# Patient Record
Sex: Female | Born: 1995 | Race: Black or African American | Hispanic: No | Marital: Single | Smoking: Former smoker
Health system: Southern US, Community
[De-identification: ages and names within clinical notes are randomized; demographics above are authoritative.]

## PROBLEM LIST (undated history)

## (undated) DIAGNOSIS — Z8619 Personal history of other infectious and parasitic diseases: Secondary | ICD-10-CM

## (undated) DIAGNOSIS — E669 Obesity, unspecified: Secondary | ICD-10-CM

## (undated) HISTORY — DX: Obesity, unspecified: E66.9

## (undated) HISTORY — DX: Personal history of other infectious and parasitic diseases: Z86.19

---
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
## 2014-05-04 ENCOUNTER — Emergency Department: Payer: Self-pay | Admitting: Emergency Medicine

## 2014-05-04 LAB — CBC
HCT: 37 % (ref 35.0–47.0)
HGB: 11.7 g/dL — ABNORMAL LOW (ref 12.0–16.0)
MCH: 26 pg (ref 26.0–34.0)
MCHC: 31.6 g/dL — ABNORMAL LOW (ref 32.0–36.0)
MCV: 82 fL (ref 80–100)
Platelet: 316 10*3/uL (ref 150–440)
RBC: 4.49 10*6/uL (ref 3.80–5.20)
RDW: 13.1 % (ref 11.5–14.5)
WBC: 17.1 10*3/uL — ABNORMAL HIGH (ref 3.6–11.0)

## 2014-05-04 LAB — URINALYSIS, COMPLETE
BILIRUBIN, UR: NEGATIVE
Glucose,UR: NEGATIVE mg/dL (ref 0–75)
KETONE: NEGATIVE
NITRITE: NEGATIVE
PH: 6 (ref 4.5–8.0)
Protein: 30
Specific Gravity: 1.02 (ref 1.003–1.030)
Squamous Epithelial: 2
WBC UR: 220 /HPF (ref 0–5)

## 2014-05-07 ENCOUNTER — Emergency Department: Payer: Self-pay | Admitting: Emergency Medicine

## 2014-05-07 LAB — URINALYSIS, COMPLETE
Bilirubin,UR: NEGATIVE
Glucose,UR: NEGATIVE mg/dL (ref 0–75)
KETONE: NEGATIVE
Nitrite: NEGATIVE
Ph: 6 (ref 4.5–8.0)
Protein: NEGATIVE
Specific Gravity: 1.016 (ref 1.003–1.030)
Squamous Epithelial: 2
WBC UR: 108 /HPF (ref 0–5)

## 2014-05-07 LAB — CBC
HCT: 35.7 % (ref 35.0–47.0)
HGB: 11.2 g/dL — ABNORMAL LOW (ref 12.0–16.0)
MCH: 26.1 pg (ref 26.0–34.0)
MCHC: 31.4 g/dL — AB (ref 32.0–36.0)
MCV: 83 fL (ref 80–100)
Platelet: 307 10*3/uL (ref 150–440)
RBC: 4.29 10*6/uL (ref 3.80–5.20)
RDW: 13.2 % (ref 11.5–14.5)
WBC: 12.2 10*3/uL — AB (ref 3.6–11.0)

## 2014-05-09 LAB — URINE CULTURE

## 2014-06-06 ENCOUNTER — Emergency Department: Payer: Self-pay | Admitting: Emergency Medicine

## 2014-06-06 LAB — PREGNANCY, URINE: Pregnancy Test, Urine: NEGATIVE m[IU]/mL

## 2014-08-27 ENCOUNTER — Emergency Department: Payer: Self-pay | Admitting: Emergency Medicine

## 2014-08-27 LAB — URINALYSIS, COMPLETE
Bacteria: NONE SEEN
Bilirubin,UR: NEGATIVE
GLUCOSE, UR: NEGATIVE mg/dL (ref 0–75)
KETONE: NEGATIVE
LEUKOCYTE ESTERASE: NEGATIVE
Nitrite: NEGATIVE
PROTEIN: NEGATIVE
Ph: 5 (ref 4.5–8.0)
SPECIFIC GRAVITY: 1.026 (ref 1.003–1.030)
Squamous Epithelial: 16
WBC UR: 4 /HPF (ref 0–5)

## 2014-08-27 LAB — PREGNANCY, URINE: Pregnancy Test, Urine: NEGATIVE m[IU]/mL

## 2014-11-17 ENCOUNTER — Emergency Department: Payer: Self-pay | Admitting: Emergency Medicine

## 2014-12-16 ENCOUNTER — Emergency Department: Payer: Self-pay | Admitting: Emergency Medicine

## 2014-12-16 LAB — URINALYSIS, COMPLETE
BILIRUBIN, UR: NEGATIVE
Glucose,UR: NEGATIVE mg/dL (ref 0–75)
KETONE: NEGATIVE
Nitrite: NEGATIVE
PH: 6 (ref 4.5–8.0)
Protein: NEGATIVE
Specific Gravity: 1.018 (ref 1.003–1.030)
Squamous Epithelial: 17

## 2014-12-18 LAB — URINE CULTURE

## 2015-01-22 ENCOUNTER — Emergency Department: Payer: Self-pay | Admitting: Emergency Medicine

## 2015-02-13 ENCOUNTER — Emergency Department: Admit: 2015-02-13 | Disposition: A | Discharge status: Home / Self Care | Payer: Self-pay | Admitting: Internal Medicine

## 2015-02-13 LAB — URINALYSIS, COMPLETE
Bacteria: NONE SEEN
Bilirubin,UR: NEGATIVE
Glucose,UR: NEGATIVE mg/dL (ref 0–75)
KETONE: NEGATIVE
Leukocyte Esterase: NEGATIVE
NITRITE: NEGATIVE
PH: 5 (ref 4.5–8.0)
Protein: NEGATIVE
RBC,UR: <1 /HPF (ref 0–5)
Specific Gravity: 1.016 (ref 1.003–1.030)
Squamous Epithelial: 1
WBC UR: 1 /HPF (ref 0–5)

## 2015-02-13 LAB — CBC
HCT: 38.1 % (ref 35.0–47.0)
HGB: 12.3 g/dL (ref 12.0–16.0)
MCH: 25.9 pg — ABNORMAL LOW (ref 26.0–34.0)
MCHC: 32.1 g/dL (ref 32.0–36.0)
MCV: 81 fL (ref 80–100)
Platelet: 318 10*3/uL (ref 150–440)
RBC: 4.73 10*6/uL (ref 3.80–5.20)
RDW: 13.5 % (ref 11.5–14.5)
WBC: 9.6 10*3/uL (ref 3.6–11.0)

## 2015-02-13 LAB — COMPREHENSIVE METABOLIC PANEL
ALBUMIN: 3.9 g/dL
ALK PHOS: 85 U/L
ALT: 14 U/L
ANION GAP: 5 — AB (ref 7–16)
AST: 19 U/L
BUN: 12 mg/dL
Bilirubin,Total: 0.6 mg/dL
CALCIUM: 9.2 mg/dL
CHLORIDE: 107 mmol/L
Co2: 25 mmol/L
Creatinine: 0.68 mg/dL
EGFR (African American): >60
EGFR (Non-African Amer.): >60
Glucose: 98 mg/dL
Potassium: 4.1 mmol/L
Sodium: 137 mmol/L
Total Protein: 7.5 g/dL

## 2015-02-13 LAB — HCG, QUANTITATIVE, PREGNANCY: BETA HCG, QUANT.: 1 m[IU]/mL

## 2015-03-12 ENCOUNTER — Emergency Department
Admit: 2015-03-12 | Disposition: A | Discharge status: Home / Self Care | Payer: Self-pay | Admitting: Emergency Medicine

## 2015-03-23 NOTE — H&P (Signed)
L&D Evaluation:  History:   HPI 19 year old G2P0 presents to L&D with c/o ctx's this AM. EDD 9/17, 40 weeks today. [facility], [facility] that resolved, and + chlamydia that was treated (neg 8/27). No significant events during pregnancy. labs: O Positive, RI, VI, HIV and Hep B Negative, RPR NR GBS Negative    Presents with contractions    Patient's Medical History No Chronic Illness    Patient's Surgical History none    Medications Pre Natal Vitamins    Allergies NKDA    Social History none    Family History Non-Contributory   ROS:   ROS All systems were reviewed.  HEENT, CNS, GI, GU, Respiratory, CV, Renal and Musculoskeletal systems were found to be normal.   Exam:   Vital Signs stable    Urine Protein not completed    General no apparent distress, very drowsy    Mental Status clear  drowsy right now    Abdomen gravid, tender with contractions    Estimated Fetal Weight Average for gestational age    Back no CVAT    Edema no edema    Pelvic no external lesions, 4 on admission, 8-9 now    Mebranes Intact, bulging    FHT normal rate with no decels    Ucx regular   Impression:   Impression active labor   Plan:   Plan EFM/NST, IV analgesia, epdiural if desired, anticipate SVD   Electronic Signatures: Shella MaximPutnam, Bralynn Velador (CNM)  (Signed 17-Sep-13 07:45)  Authored: L&D Evaluation   Last Updated: 17-Sep-13 07:45 by Shella MaximPutnam, Shadai Mcclane (CNM)

## 2015-03-23 NOTE — H&P (Signed)
L&D Evaluation:  History Expanded:  HPI 19 [facility], she was last seen in clinic in September, she has not had A GBS or aptima done, she is 39 w 3 days, and SROM at 1100 today no bleeding good fetal movement, having some cramping and some contractions, she has not been checked yet. Has not gotten her tdap, or flu, only 4 visits her whole preganncy   Gravida 3   Term 1   PreTerm 0   Abortion 1   Living 1   Blood Type (Maternal) O positive   Group B Strep Results Maternal (Result >5wks must be treated as unknown) unknown/result > 5 weeks ago   Maternal HIV Negative   Maternal Syphilis Ab Nonreactive   Maternal Varicella Immune   Rubella Results (Maternal) immune   Maternal T-Dap Unknown   [facility]   Presents with contractions, SROM   Patient's Medical History No Chronic Illness   Patient's Surgical History arm surgery ; T and A,   Medications Pre Natal Vitamins  Iron   Allergies NKDA   Social History none   Family History Non-Contributory   Current Prenatal Course Notable For very poor prenatal care   ROS:  ROS All systems were reviewed.  HEENT, CNS, GI, GU, Respiratory, CV, Renal and Musculoskeletal systems were found to be normal.   Exam:  Vital Signs stable   Urine Protein not completed   General no apparent distress   Mental Status clear   Chest clear   Heart normal sinus rhythm   Abdomen gravid, non-tender   Estimated Fetal Weight Small for gestational age   Fetal Position v   Back no CVAT   Edema no edema   Reflexes 1+   Pelvic no external lesions   Mebranes Ruptured   Description clear   FHT normal rate with no decels, strictly reactive,   Fetal Heart Rate 140   Ucx irregular   Skin dry   Lymph no lymphadenopathy   Impression:  Impression early labor, unknown GBS   Plan:  Plan EFM/NST, antibiotics for GBBS prophylaxis   Comments anticipate svd, start pitocin  after second dose of antibiotics.   Follow Up Appointment need to schedule. in 6 weeks   Electronic Signatures: Adria DevonKlett, Gideon Burstein (MD)  (Signed 06-Nov-14 16:11)  Authored: L&D Evaluation   Last Updated: 06-Nov-14 16:11 by Adria DevonKlett, Nettie Wyffels (MD)

## 2015-04-27 ENCOUNTER — Encounter: Payer: Self-pay | Admitting: Emergency Medicine

## 2015-04-27 ENCOUNTER — Emergency Department
Admission: EM | Admit: 2015-04-27 | Discharge: 2015-04-27 | Disposition: A | Discharge status: Home / Self Care | Payer: Self-pay | Attending: Emergency Medicine | Admitting: Emergency Medicine

## 2015-04-27 DIAGNOSIS — J029 Acute pharyngitis, unspecified: Secondary | ICD-10-CM

## 2015-04-27 DIAGNOSIS — J0110 Acute frontal sinusitis, unspecified: Secondary | ICD-10-CM | POA: Insufficient documentation

## 2015-04-27 DIAGNOSIS — Z720 Tobacco use: Secondary | ICD-10-CM | POA: Insufficient documentation

## 2015-04-27 MED ORDER — LORATADINE 10 MG PO TABS: 10.0000 mg | ORAL_TABLET | Freq: Every day | ORAL | Status: DC

## 2015-04-27 MED ORDER — AMOXICILLIN 500 MG PO CAPS
ORAL_CAPSULE | ORAL | Status: AC
  Administered 2015-04-27: 500 mg via ORAL
  Filled 2015-04-27: qty 1

## 2015-04-27 MED ORDER — AMOXICILLIN 500 MG PO TABS
500.0000 mg | ORAL_TABLET | Freq: Three times a day (TID) | ORAL | Status: DC
Start: 2015-04-27 — End: 2015-12-01

## 2015-04-27 MED ORDER — LORATADINE-PSEUDOEPHEDRINE ER 5-120 MG PO TB12: 1.0000 | ORAL_TABLET | Freq: Two times a day (BID) | ORAL | Status: DC

## 2015-04-27 MED ORDER — AMOXICILLIN 500 MG PO TABS: 500.0000 mg | ORAL_TABLET | Freq: Three times a day (TID) | ORAL | Status: DC

## 2015-04-27 MED ORDER — DEXAMETHASONE SODIUM PHOSPHATE 10 MG/ML IJ SOLN
INTRAMUSCULAR | Status: AC
  Administered 2015-04-27: 10 mg via INTRAMUSCULAR
  Filled 2015-04-27: qty 1

## 2015-04-27 MED ORDER — OXYMETAZOLINE HCL 0.05 % NA SOLN: 1.0000 | Freq: Two times a day (BID) | NASAL | Status: DC

## 2015-04-27 MED ORDER — KETOROLAC TROMETHAMINE 30 MG/ML IJ SOLN
30.0000 mg | Freq: Once | INTRAMUSCULAR | Status: AC
  Administered 2015-04-27: 30 mg via INTRAVENOUS

## 2015-04-27 MED ORDER — AMOXICILLIN 500 MG PO CAPS
500.0000 mg | ORAL_CAPSULE | Freq: Once | ORAL | Status: AC
  Administered 2015-04-27: 500 mg via ORAL

## 2015-04-27 MED ORDER — DEXAMETHASONE SODIUM PHOSPHATE 10 MG/ML IJ SOLN
10.0000 mg | Freq: Once | INTRAMUSCULAR | Status: AC
  Administered 2015-04-27: 10 mg via INTRAMUSCULAR

## 2015-04-27 MED ORDER — PREDNISONE 20 MG PO TABS: 40.0000 mg | ORAL_TABLET | Freq: Every day | ORAL | Status: DC

## 2015-04-27 MED ORDER — KETOROLAC TROMETHAMINE 30 MG/ML IJ SOLN
INTRAMUSCULAR | Status: AC
  Administered 2015-04-27: 30 mg via INTRAVENOUS
  Filled 2015-04-27: qty 1

## 2015-04-27 MED ORDER — SODIUM CHLORIDE 0.9 % IV BOLUS (SEPSIS)
1000.0000 mL | Freq: Once | INTRAVENOUS | Status: AC
Start: 2015-04-27 — End: 2015-04-27
  Administered 2015-04-27: 1000 mL via INTRAVENOUS

## 2015-04-27 NOTE — ED Notes (Signed)
Sore throat and headache for two days.

## 2015-04-27 NOTE — ED Provider Notes (Signed)
CSN: [id number]     Arrival date & time 04/27/15  1552 History   First MD Initiated Contact with Patient 04/27/15 1643     Chief Complaint  Patient presents with  . Sore Throat     (Consider location/radiation/quality/duration/timing/severity/associated sxs/prior Treatment) HPI 4 day history of worsening sore throat subjective fevers 1 day of not eating or drinking due to burning in the back of her throat swelling also notes sinus congestion and drainage rates her overall discomfort as a 10 out of 10 burning type pain in her head throbbing nothing making it particularly better or worse has not tried any prior medications her home therapies and states no other complaints at this time   History reviewed. No pertinent past medical history. History reviewed. No pertinent past surgical history. No family history on file. History  Substance Use Topics  . Smoking status: Current Every Day Smoker  . Smokeless tobacco: Not on file  . Alcohol Use: Yes   OB History    No data available     Review of Systems  Constitutional: Positive for fever.  HENT: Positive for congestion, ear pain, postnasal drip, rhinorrhea, sinus pressure and sore throat.   Eyes: Negative.   Respiratory: Negative.   Cardiovascular: Negative.   Gastrointestinal: Negative.   Endocrine: Negative.   Genitourinary: Negative.   Musculoskeletal: Negative.   Skin: Negative.   Allergic/Immunologic: Negative.   Neurological: Negative.   All other systems reviewed and are negative.     Allergies  Review of patient's allergies indicates no known allergies.  Home Medications   Prior to Admission medications   Medication Sig Start Date End Date Taking? Authorizing Provider  amoxicillin (AMOXIL) 500 MG tablet Take 1 tablet (500 mg total) by mouth 3 (three) times daily. 04/27/15   Briyah Wheelwright William C Athol Bolds, PA-C  loratadine (CLARITIN) 10 MG tablet Take 1 tablet (10 mg total) by mouth daily. 04/27/15   Fayette Hamada Kristine Garbe Emma Birchler,  PA-C  oxymetazoline (AFRIN NASAL SPRAY) 0.05 % nasal spray Place 1 spray into both nostrils 2 (two) times daily. For three days 04/27/15   Carnelius Hammitt William C Faye Strohman, PA-C  predniSONE (DELTASONE) 20 MG tablet Take 2 tablets (40 mg total) by mouth daily with breakfast. 04/27/15   Kyle Stansell Kristine Garbe Elianis Fischbach, PA-C   BP 132/55 mmHg  Pulse 102  Temp(Src) 99.4 F (37.4 C) (Oral)  Resp 16  Ht  (1.549 m)  Wt 185 lb (83.915 kg)  BMI 34.97 kg/m2  SpO2 98%  LMP 04/20/2015 Physical Exam  Female appearing stated age well-developed well-nourished no acute distress Vitals reviewed Head ears eyes nose neck and throat examination this patient reveals frontal sinus tenderness +2 tonsils exudate anterior cervical adenopathy Cardiovascular regular rate and rhythm no murmurs rubs gallops Pulmonary lungs clear to auscultation bilaterally Skin free of rash or disease Musculoskeletal moving all extremities without difficulty no noticeable functional deficits Neuro exam nonfocal cranial nerves II through XII grossly intact Psychologically patient is very pleasant acting appropriately   ED Course  Procedures (including critical care time) Labs Review Labs Reviewed - No data to display  Imaging Review No results found.   EKG Interpretation None     Given patient's history of not eating or drinking due to throat swelling and soreness in her tachycardia she was given 1 L bolus of fluids IV Decadron and Toradol amoxicillin was started she is feeling better    MDM  go ahead and discharge her home return if any acute concerns or  worsening symptoms Amoxicillin prednisone decongestants   Final diagnoses:  Acute pharyngitis, unspecified pharyngitis type  Acute frontal sinusitis, recurrence not specified        Talene Glastetter Rosalyn Gess, PA-C 04/27/15 1912  Loleta Rose, MD 04/28/15 0028

## 2015-05-20 ENCOUNTER — Encounter: Payer: Self-pay | Admitting: Emergency Medicine

## 2015-05-20 ENCOUNTER — Emergency Department
Admission: EM | Admit: 2015-05-20 | Discharge: 2015-05-20 | Disposition: A | Discharge status: Home / Self Care | Payer: Self-pay | Attending: Emergency Medicine | Admitting: Emergency Medicine

## 2015-05-20 DIAGNOSIS — K029 Dental caries, unspecified: Secondary | ICD-10-CM | POA: Insufficient documentation

## 2015-05-20 DIAGNOSIS — K0889 Other specified disorders of teeth and supporting structures: Secondary | ICD-10-CM

## 2015-05-20 DIAGNOSIS — Z792 Long term (current) use of antibiotics: Secondary | ICD-10-CM | POA: Insufficient documentation

## 2015-05-20 DIAGNOSIS — Z79899 Other long term (current) drug therapy: Secondary | ICD-10-CM | POA: Insufficient documentation

## 2015-05-20 DIAGNOSIS — Z720 Tobacco use: Secondary | ICD-10-CM | POA: Insufficient documentation

## 2015-05-20 DIAGNOSIS — Z7952 Long term (current) use of systemic steroids: Secondary | ICD-10-CM | POA: Insufficient documentation

## 2015-05-20 DIAGNOSIS — K088 Other specified disorders of teeth and supporting structures: Secondary | ICD-10-CM | POA: Insufficient documentation

## 2015-05-20 DIAGNOSIS — K047 Periapical abscess without sinus: Secondary | ICD-10-CM | POA: Insufficient documentation

## 2015-05-20 MED ORDER — IBUPROFEN 800 MG PO TABS: 800.0000 mg | ORAL_TABLET | Freq: Three times a day (TID) | ORAL | Status: DC | PRN

## 2015-05-20 MED ORDER — HYDROCODONE-ACETAMINOPHEN 5-325 MG PO TABS
1.0000 | ORAL_TABLET | Freq: Once | ORAL | Status: AC
  Administered 2015-05-20: 1 via ORAL

## 2015-05-20 MED ORDER — PENICILLIN V POTASSIUM 500 MG PO TABS
500.0000 mg | ORAL_TABLET | Freq: Once | ORAL | Status: AC
  Administered 2015-05-20: 500 mg via ORAL

## 2015-05-20 MED ORDER — PENICILLIN V POTASSIUM 500 MG PO TABS
ORAL_TABLET | ORAL | Status: AC
  Administered 2015-05-20: 500 mg via ORAL
  Filled 2015-05-20: qty 1

## 2015-05-20 MED ORDER — HYDROCODONE-ACETAMINOPHEN 5-325 MG PO TABS
ORAL_TABLET | ORAL | Status: AC
  Administered 2015-05-20: 1 via ORAL
  Filled 2015-05-20: qty 1

## 2015-05-20 MED ORDER — PENICILLIN V POTASSIUM 500 MG PO TABS: 500.0000 mg | ORAL_TABLET | Freq: Four times a day (QID) | ORAL | Status: DC

## 2015-05-20 NOTE — Discharge Instructions (Signed)
You do not have a drainable dental abscess at this time. Take Pen-Vee K. Use ibuprofen for discomfort. The need to follow-up with a dentist for further care, otherwise this problem will likely continue to reoccur. Return to the emergency department if you're swelling gets worse, if your face is swelling, if you have fevers, or if you have other urgent concerns.  Dental Abscess A dental abscess is a collection of infected fluid (pus) from a bacterial infection in the inner part of the tooth (pulp). It usually occurs at the end of the tooth's root.  CAUSES   Severe tooth decay.  Trauma to the tooth that allows bacteria to enter into the pulp, such as a broken or chipped tooth. SYMPTOMS   Severe pain in and around the infected tooth.  Swelling and redness around the abscessed tooth or in the mouth or face.  Tenderness.  Pus drainage.  Bad breath.  Bitter taste in the mouth.  Difficulty swallowing.  Difficulty opening the mouth.  Nausea.  Vomiting.  Chills.  Swollen neck glands. DIAGNOSIS   A medical and dental history will be taken.  An examination will be performed by tapping on the abscessed tooth.  X-rays may be taken of the tooth to identify the abscess. TREATMENT The goal of treatment is to eliminate the infection. You may be prescribed antibiotic medicine to stop the infection from spreading. A root canal may be performed to save the tooth. If the tooth cannot be saved, it may be pulled (extracted) and the abscess may be drained.  HOME CARE INSTRUCTIONS  Only take over-the-counter or prescription medicines for pain, fever, or discomfort as directed by your caregiver.  Rinse your mouth (gargle) often with salt water ( tsp salt in 8 oz [250 ml] of warm water) to relieve pain or swelling.  Do not drive after taking pain medicine (narcotics).  Do not apply heat to the outside of your face.  Return to your dentist for further treatment as directed. SEEK MEDICAL  CARE IF:  Your pain is not helped by medicine.  Your pain is getting worse instead of better. SEEK IMMEDIATE MEDICAL CARE IF:  You have a fever or persistent symptoms for more than 2-3 days.  You have a fever and your symptoms suddenly get worse.  You have chills or a very bad headache.  You have problems breathing or swallowing.  You have trouble opening your mouth.  You have swelling in the neck or around the eye. Document Released: 10/30/2005 Document Revised: 07/24/2012 Document Reviewed: 02/07/2011 [facility] Patient Information 2015 [employer] [place]. This information is not intended to replace advice given to you by your health care provider. Make sure you discuss any questions you have with your health care provider.

## 2015-05-20 NOTE — ED Notes (Signed)
Patient states that she has an abscess to the inside of her left mouth times three days. Patient reports that it did drain this morning but that she continues to have pain and swelling.

## 2015-05-20 NOTE — ED Provider Notes (Signed)
[facility] Emergency Department Provider Note  ____________________________________________  Time seen: 2000  I have reviewed the triage vital signs and the nursing notes.   HISTORY  Chief Complaint Abscess  dental pain    HPI Brianna Juarez is a 19 y.o. female who reports that she has been having pain in her left lower jaw for 2-3 days and earlier today she felt drainage from an abscess in this area.  She reports this is the second and third time that this problem has occurred in this area. She has not been able to give a dentist with the prior episodes.  She does have a significantly decayed tooth in this area (#19).  There is some mild swelling to the left jaw with some discomfort.  He denies any fever.   No past medical history on file. Patient reports no medical problems.   There are no active problems to display for this patient.   No past surgical history on file.  Current Outpatient Rx  Name  Route  Sig  Dispense  Refill  . amoxicillin (AMOXIL) 500 MG tablet   Oral   Take 1 tablet (500 mg total) by mouth 3 (three) times daily.   21 tablet   0   . ibuprofen (ADVIL,MOTRIN) 800 MG tablet   Oral   Take 1 tablet (800 mg total) by mouth every 8 (eight) hours as needed.   24 tablet   0   . loratadine (CLARITIN) 10 MG tablet   Oral   Take 1 tablet (10 mg total) by mouth daily.   30 tablet   0   . oxymetazoline (AFRIN NASAL SPRAY) 0.05 % nasal spray   Each Nare   Place 1 spray into both nostrils 2 (two) times daily. For three days   30 mL   0   . penicillin v potassium (VEETID) 500 MG tablet   Oral   Take 1 tablet (500 mg total) by mouth 4 (four) times daily.   24 tablet   0   . predniSONE (DELTASONE) 20 MG tablet   Oral   Take 2 tablets (40 mg total) by mouth daily with breakfast.   8 tablet   0     Allergies Review of patient's allergies indicates no known allergies.  No family history on file.  Social  History History  Substance Use Topics  . Smoking status: Current Every Day Smoker -- 0.25 packs/day  . Smokeless tobacco: Not on file  . Alcohol Use: Yes     Comment: occasionaly    Review of Systems  Constitutional: Negative for fever. ENT: Patient had a sore throat approximately 3 and half weeks ago. She is seen in the emergency department that time. Sore throat is resolved. Patient is of dental pain. See history of present illness Cardiovascular: Negative for chest pain. Respiratory: Negative for shortness of breath. Gastrointestinal: Negative for abdominal pain, vomiting and diarrhea. Genitourinary: Negative for dysuria. Musculoskeletal: No myalgias or injuries.    10-point ROS otherwise negative.  ____________________________________________   PHYSICAL EXAM:  VITAL SIGNS: ED Triage Vitals  Enc Vitals Group     BP 05/20/15 1951 122/93 mmHg     Pulse Rate 05/20/15 1951 86     Resp 05/20/15 1951 18     Temp 05/20/15 1951 98.1 F (36.7 C)     Temp Source 05/20/15 1951 Oral     SpO2 05/20/15 1951 97 %     Weight 05/20/15 1951 185 lb (83.915 kg)  Height 05/20/15 1951 5\' 1"  (1.549 m)     Head Cir --      Peak Flow --      Pain Score 05/20/15 1951 10     Pain Loc --      Pain Edu? --      Excl. in GC? --     Constitutional: Alert and oriented. Well appearing and in no distress. ENT   Head: Normocephalic and atraumatic.   Nose: No congestion/rhinnorhea.   Mouth/Throat: Mucous membranes are moist. There is a tooth with significant decay on the left lower (#19). There is some mild inflammation around this tooth with redness. There is no appreciable abscess. There is a degree of fullness in the left lower jaw adjacent to where this decay to this. Cardiovascular: Normal rate, regular rhythm, no murmur noted Respiratory:  Normal respiratory effort, no tachypnea.    Breath sounds are clear and equal bilaterally.  Gastrointestinal: Soft and nontender. No  distention.  Back: No muscle spasm, no tenderness, no CVA tenderness. Musculoskeletal: No deformity noted. Nontender with normal range of motion in all extremities.  No noted edema. Psychiatric: Mood and affect are normal. Speech and behavior are normal.  ____________________________________________    INITIAL IMPRESSION / ASSESSMENT AND PLAN / ED COURSE  Pertinent labs & imaging results that were available during my care of the patient were reviewed by me and considered in my medical decision making (see chart for details).   Patient with a ental infection and dental pain. I do not see a drainable abscess. This is likely in part due to the fact that what small pocket she may have had has already self drained.  We will prescribe pen VK and ibuprofen for this patient. I will provide her with 1 Vicodin now will she is in the emergency department. I have counseled her on the importance of following with a dentist for this recurrent problem.  The patient did ask me how long until she could resume smoking cigarettes. I took this opportunity to counsel the patient on cessation. We discussed the reasons he smokes and ways to quit. The patient is vacillating regarding quitting.  ____________________________________________   FINAL CLINICAL IMPRESSION(S) / ED DIAGNOSES  Final diagnoses:  Pain, dental  Tooth decay  Dental infection      Darien Ramusavid W Charlii Yost, MD 05/20/15 2017

## 2015-06-08 ENCOUNTER — Encounter: Payer: Self-pay | Admitting: *Deleted

## 2015-06-08 ENCOUNTER — Emergency Department
Admission: EM | Admit: 2015-06-08 | Discharge: 2015-06-08 | Disposition: A | Discharge status: Home / Self Care | Payer: Self-pay | Attending: Emergency Medicine | Admitting: Emergency Medicine

## 2015-06-08 ENCOUNTER — Other Ambulatory Visit: Payer: Self-pay

## 2015-06-08 DIAGNOSIS — K047 Periapical abscess without sinus: Secondary | ICD-10-CM | POA: Insufficient documentation

## 2015-06-08 DIAGNOSIS — Z720 Tobacco use: Secondary | ICD-10-CM | POA: Insufficient documentation

## 2015-06-08 MED ORDER — IBUPROFEN 800 MG PO TABS: 800.0000 mg | ORAL_TABLET | Freq: Three times a day (TID) | ORAL | Status: DC | PRN

## 2015-06-08 MED ORDER — CEPHALEXIN 250 MG PO CAPS
250.0000 mg | ORAL_CAPSULE | Freq: Four times a day (QID) | ORAL | Status: AC
Start: 2015-06-08 — End: 2015-06-18

## 2015-06-08 NOTE — ED Provider Notes (Addendum)
[facility] Emergency Department Provider Note     Time seen: ----------------------------------------- 9:20 PM on 06/08/2015 -----------------------------------------    I have reviewed the triage vital signs and the nursing notes.   HISTORY  Chief Complaint Dental Pain and Pleurisy    HPI Brianna Juarez is a 19 y.o. female who presents ER with left lower jaw pain for several days. Patient was seen a couple weeks ago for the same thing. Also states it hurts take a deep breath.  Plan as jaw pain earlier today she felt drainage from the abscess in this area. Pain is ache, severe.   No past medical history on file.  There are no active problems to display for this patient.   No past surgical history on file.  Allergies Review of patient's allergies indicates no known allergies.  Social History History  Substance Use Topics  . Smoking status: Current Every Day Smoker -- 0.25 packs/day  . Smokeless tobacco: Not on file  . Alcohol Use: Yes     Comment: occasionaly    Review of Systems Constitutional: Negative for fever. Eyes: Negative for visual changes. ENT: Negative for sore throat. Positive for left-sided jaw pain and toothache  10-point ROS otherwise negative.  ____________________________________________   PHYSICAL EXAM:  VITAL SIGNS: ED Triage Vitals  Enc Vitals Group     BP --      Pulse Rate 06/08/15 2053 98     Resp 06/08/15 2053 18     Temp 06/08/15 2053 98.2 F (36.8 C)     Temp Source 06/08/15 2053 Oral     SpO2 06/08/15 2053 100 %     Weight 06/08/15 2053 185 lb (83.915 kg)     Height 06/08/15 2053  (1.575 m)     Head Cir --      Peak Flow --      Pain Score 06/08/15 2054 9     Pain Loc --      Pain Edu? --      Excl. in GC? --   EKG: Normal sinus rhythm with a rate of 18 99 bpm, normal PR interval, normal QRS with, normal QT interval. There are some nonspecific T-wave inversions that are consistent  with prior EKG.  Constitutional: Alert and oriented. Well appearing and in no distress. Eyes: Conjunctivae are normal. PERRL. Normal extraocular movements. ENT   Head: Normocephalic and atraumatic.   Nose: No congestion/rhinnorhea.   Mouth/Throat: Pointing Dental abscess noted around tooth #18   Neck: No stridor. ____________________________________________  ED COURSE:  Pertinent labs & imaging results that were available during my care of the patient were reviewed by me and considered in my medical decision making (see chart for details). I was able to take a tongue blade and open the abscess with copious purulent drainage. ____________________________________________  FINAL ASSESSMENT AND PLAN  Dental abscess status post drainage  Plan: Patient with labs and imaging as dictated above. Patient will continue home with antibiotics, pain medication and dental referral. I'm unsure what to make of her chest pain complaints, she looks well, stable for outpatient follow-up.   Emily Filbert, MD   Emily Filbert, MD 06/08/15 0865  Emily Filbert, MD 06/08/15 2128

## 2015-06-08 NOTE — Discharge Instructions (Signed)

## 2015-06-08 NOTE — ED Notes (Signed)
Pt has pain in left lower tooth since yesterday.  Pt also reports  chest pain.  States it hurts to take a deep breath.  Sx began this am.

## 2015-09-23 ENCOUNTER — Emergency Department
Admission: EM | Admit: 2015-09-23 | Discharge: 2015-09-23 | Disposition: A | Discharge status: Home / Self Care | Payer: Self-pay | Attending: Emergency Medicine | Admitting: Emergency Medicine

## 2015-09-23 DIAGNOSIS — Z3490 Encounter for supervision of normal pregnancy, unspecified, unspecified trimester: Secondary | ICD-10-CM

## 2015-09-23 DIAGNOSIS — O2341 Unspecified infection of urinary tract in pregnancy, first trimester: Secondary | ICD-10-CM | POA: Insufficient documentation

## 2015-09-23 DIAGNOSIS — Z87891 Personal history of nicotine dependence: Secondary | ICD-10-CM | POA: Insufficient documentation

## 2015-09-23 DIAGNOSIS — Z79899 Other long term (current) drug therapy: Secondary | ICD-10-CM | POA: Insufficient documentation

## 2015-09-23 DIAGNOSIS — Z7952 Long term (current) use of systemic steroids: Secondary | ICD-10-CM | POA: Insufficient documentation

## 2015-09-23 DIAGNOSIS — R42 Dizziness and giddiness: Secondary | ICD-10-CM | POA: Insufficient documentation

## 2015-09-23 DIAGNOSIS — Z792 Long term (current) use of antibiotics: Secondary | ICD-10-CM | POA: Insufficient documentation

## 2015-09-23 DIAGNOSIS — Z3A01 Less than 8 weeks gestation of pregnancy: Secondary | ICD-10-CM | POA: Insufficient documentation

## 2015-09-23 LAB — CBC
HCT: 36.3 % (ref 35.0–47.0)
HEMOGLOBIN: 12.1 g/dL (ref 12.0–16.0)
MCH: 26.4 pg (ref 26.0–34.0)
MCHC: 33.3 g/dL (ref 32.0–36.0)
MCV: 79.4 fL — AB (ref 80.0–100.0)
Platelets: 359 10*3/uL (ref 150–440)
RBC: 4.57 MIL/uL (ref 3.80–5.20)
RDW: 13.9 % (ref 11.5–14.5)
WBC: 10.3 10*3/uL (ref 3.6–11.0)

## 2015-09-23 LAB — COMPREHENSIVE METABOLIC PANEL
ALK PHOS: 88 U/L (ref 38–126)
ALT: 12 U/L — AB (ref 14–54)
AST: 16 U/L (ref 15–41)
Albumin: 4.1 g/dL (ref 3.5–5.0)
Anion gap: 6 (ref 5–15)
BILIRUBIN TOTAL: 0.4 mg/dL (ref 0.3–1.2)
BUN: 11 mg/dL (ref 6–20)
CALCIUM: 9.3 mg/dL (ref 8.9–10.3)
CO2: 26 mmol/L (ref 22–32)
CREATININE: 0.64 mg/dL (ref 0.44–1.00)
Chloride: 103 mmol/L (ref 101–111)
Glucose, Bld: 90 mg/dL (ref 65–99)
Potassium: 3.6 mmol/L (ref 3.5–5.1)
SODIUM: 135 mmol/L (ref 135–145)
Total Protein: 8.1 g/dL (ref 6.5–8.1)

## 2015-09-23 LAB — URINALYSIS COMPLETE WITH MICROSCOPIC (ARMC ONLY)
Bilirubin Urine: NEGATIVE
Glucose, UA: NEGATIVE mg/dL
Hgb urine dipstick: NEGATIVE
KETONES UR: NEGATIVE mg/dL
Nitrite: NEGATIVE
Protein, ur: NEGATIVE mg/dL
Specific Gravity, Urine: 1.017 (ref 1.005–1.030)
pH: 6 (ref 5.0–8.0)

## 2015-09-23 LAB — POCT PREGNANCY, URINE: Preg Test, Ur: POSITIVE — AB

## 2015-09-23 MED ORDER — PRENATAL MULTIVITAMIN CH: 1.0000 | ORAL_TABLET | Freq: Every day | ORAL | Status: DC

## 2015-09-23 MED ORDER — CEPHALEXIN 500 MG PO CAPS: 500.0000 mg | ORAL_CAPSULE | Freq: Two times a day (BID) | ORAL | Status: DC

## 2015-09-23 NOTE — Discharge Instructions (Signed)
Your evaluation in the emergency department was reassuring.  We recommend that you drink plenty of clear fluids and follow-up with [facility] when the time as appropriate for your first visit.  Return to the emergency department with new or worsening symptoms that concern you.   First Trimester of Pregnancy The first trimester of pregnancy is from week 1 until the end of week 12 (months 1 through 3). A week after a sperm fertilizes an egg, the egg will implant on the wall of the uterus. This embryo will begin to develop into a baby. Genes from you and your partner are forming the baby. The female genes determine whether the baby is a boy or a girl. At 6-8 weeks, the eyes and face are formed, and the heartbeat can be seen on ultrasound. At the end of 12 weeks, all the baby's organs are formed.  Now that you are pregnant, you will want to do everything you can to have a healthy baby. Two of the most important things are to get good prenatal care and to follow your health care provider's instructions. Prenatal care is all the medical care you receive before the baby's birth. This care will help prevent, find, and treat any problems during the pregnancy and childbirth. BODY CHANGES Your body goes through many changes during pregnancy. The changes vary from woman to woman.   You may gain or lose a couple of pounds at first.  You may feel sick to your stomach (nauseous) and throw up (vomit). If the vomiting is uncontrollable, call your health care provider.  You may tire easily.  You may develop headaches that can be relieved by medicines approved by your health care provider.  You may urinate more often. Painful urination may mean you have a bladder infection.  You may develop heartburn as a result of your pregnancy.  You may develop constipation because certain hormones are causing the muscles that push waste through your intestines to slow down.  You may develop hemorrhoids or swollen, bulging  veins (varicose veins).  Your breasts may begin to grow larger and become tender. Your nipples may stick out more, and the tissue that surrounds them (areola) may become darker.  Your gums may bleed and may be sensitive to brushing and flossing.  Dark spots or blotches (chloasma, mask of pregnancy) may develop on your face. This will likely fade after the baby is born.  Your menstrual periods will stop.  You may have a loss of appetite.  You may develop cravings for certain kinds of food.  You may have changes in your emotions from day to day, such as being excited to be pregnant or being concerned that something may go wrong with the pregnancy and baby.  You may have more vivid and strange dreams.  You may have changes in your hair. These can include thickening of your hair, rapid growth, and changes in texture. Some women also have hair loss during or after pregnancy, or hair that feels dry or thin. Your hair will most likely return to normal after your baby is born. WHAT TO EXPECT AT YOUR PRENATAL VISITS During a routine prenatal visit:  You will be weighed to make sure you and the baby are growing normally.  Your blood pressure will be taken.  Your abdomen will be measured to track your baby's growth.  The fetal heartbeat will be listened to starting around week 10 or 12 of your pregnancy.  Test results from any previous visits will  be discussed. Your health care provider may ask you:  How you are feeling.  If you are feeling the baby move.  If you have had any abnormal symptoms, such as leaking fluid, bleeding, severe headaches, or abdominal cramping.  If you are using any tobacco products, including cigarettes, chewing tobacco, and electronic cigarettes.  If you have any questions. Other tests that may be performed during your first trimester include:  Blood tests to find your blood type and to check for the presence of any previous infections. They will also be used  to check for low iron levels (anemia) and Rh antibodies. Later in the pregnancy, blood tests for diabetes will be done along with other tests if problems develop.  Urine tests to check for infections, diabetes, or protein in the urine.  An ultrasound to confirm the proper growth and development of the baby.  An amniocentesis to check for possible genetic problems.  Fetal screens for spina bifida and Down syndrome.  You may need other tests to make sure you and the baby are doing well.  HIV (human immunodeficiency virus) testing. Routine prenatal testing includes screening for HIV, unless you choose not to have this test. HOME CARE INSTRUCTIONS  Medicines  Follow your health care provider's instructions regarding medicine use. Specific medicines may be either safe or unsafe to take during pregnancy.  Take your prenatal vitamins as directed.  If you develop constipation, try taking a stool softener if your health care provider approves. Diet  Eat regular, well-balanced meals. Choose a variety of foods, such as meat or vegetable-based protein, fish, milk and low-fat dairy products, vegetables, fruits, and whole grain breads and cereals. Your health care provider will help you determine the amount of weight gain that is right for you.  Avoid raw meat and uncooked cheese. These carry germs that can cause birth defects in the baby.  Eating four or five small meals rather than three large meals a day may help relieve nausea and vomiting. If you start to feel nauseous, eating a few soda crackers can be helpful. Drinking liquids between meals instead of during meals also seems to help nausea and vomiting.  If you develop constipation, eat more high-fiber foods, such as fresh vegetables or fruit and whole grains. Drink enough fluids to keep your urine clear or pale yellow. Activity and Exercise  Exercise only as directed by your health care provider. Exercising will help you:  Control your  weight.  Stay in shape.  Be prepared for labor and delivery.  Experiencing pain or cramping in the lower abdomen or low back is a good sign that you should stop exercising. Check with your health care provider before continuing normal exercises.  Try to avoid standing for long periods of time. Move your legs often if you must stand in one place for a long time.  Avoid heavy lifting.  Wear low-heeled shoes, and practice good posture.  You may continue to have sex unless your health care provider directs you otherwise. Relief of Pain or Discomfort  Wear a good support bra for breast tenderness.   Take warm sitz baths to soothe any pain or discomfort caused by hemorrhoids. Use hemorrhoid cream if your health care provider approves.   Rest with your legs elevated if you have leg cramps or low back pain.  If you develop varicose veins in your legs, wear support hose. Elevate your feet for 15 minutes, 3-4 times a day. Limit salt in your diet. Prenatal Care  Schedule your prenatal visits by the twelfth week of pregnancy. They are usually scheduled monthly at first, then more often in the last 2 months before delivery.  Write down your questions. Take them to your prenatal visits.  Keep all your prenatal visits as directed by your health care provider. Safety  Wear your seat belt at all times when driving.  Make a list of emergency phone numbers, including numbers for family, friends, the hospital, and police and fire departments. General Tips  Ask your health care provider for a referral to a local prenatal education class. Begin classes no later than at the beginning of month 6 of your pregnancy.  Ask for help if you have counseling or nutritional needs during pregnancy. Your health care provider can offer advice or refer you to specialists for help with various needs.  Do not use hot tubs, steam rooms, or saunas.  Do not douche or use tampons or scented sanitary pads.  Do  not cross your legs for long periods of time.  Avoid cat litter boxes and soil used by cats. These carry germs that can cause birth defects in the baby and possibly loss of the fetus by miscarriage or stillbirth.  Avoid all smoking, herbs, alcohol, and medicines not prescribed by your health care provider. Chemicals in these affect the formation and growth of the baby.  Do not use any tobacco products, including cigarettes, chewing tobacco, and electronic cigarettes. If you need help quitting, ask your health care provider. You may receive counseling support and other resources to help you quit.  Schedule a dentist appointment. At home, brush your teeth with a soft toothbrush and be gentle when you floss. SEEK MEDICAL CARE IF:   You have dizziness.  You have mild pelvic cramps, pelvic pressure, or nagging pain in the abdominal area.  You have persistent nausea, vomiting, or diarrhea.  You have a bad smelling vaginal discharge.  You have pain with urination.  You notice increased swelling in your face, hands, legs, or ankles. SEEK IMMEDIATE MEDICAL CARE IF:   You have a fever.  You are leaking fluid from your vagina.  You have spotting or bleeding from your vagina.  You have severe abdominal cramping or pain.  You have rapid weight gain or loss.  You vomit blood or material that looks like coffee grounds.  You are exposed to [place] measles and have never had them.  You are exposed to fifth disease or chickenpox.  You develop a severe headache.  You have shortness of breath.  You have any kind of trauma, such as from a fall or a car accident.   This information is not intended to replace advice given to you by your health care provider. Make sure you discuss any questions you have with your health care provider.   Document Released: 10/24/2001 Document Revised: 11/20/2014 Document Reviewed: 09/09/2013 Elsevier Interactive Patient Education [employer]

## 2015-09-23 NOTE — ED Provider Notes (Signed)
[facility] Emergency Department Provider Note  ____________________________________________  Time seen: Approximately 9:57 PM  I have reviewed the triage vital signs and the nursing notes.   HISTORY  Chief Complaint Dizziness    HPI Brianna Juarez is a 19 y.o. female G3 P2 who just discovered she was pregnant and maybe at most [redacted] weeks gestation.  She took a home pregnancy test a few days ago and it was positive.  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  She is in no acute distress in the emergency department, ambulating without difficulty, taxing on the phone in her exam room, and has normal vital signs.  She denies fever/chills, chest pain, abdominal pain, vaginal bleeding, dysuria.   History reviewed. No pertinent past medical history.  There are no active problems to display for this patient.   Past Surgical History  Procedure Laterality Date  . Therapeutic abortion      Current Outpatient Rx  Name  Route  Sig  Dispense  Refill  . amoxicillin (AMOXIL) 500 MG tablet   Oral   Take 1 tablet (500 mg total) by mouth 3 (three) times daily.   21 tablet   0   . cephALEXin (KEFLEX) 500 MG capsule   Oral   Take 1 capsule (500 mg total) by mouth 2 (two) times daily.   14 capsule   0   . ibuprofen (ADVIL,MOTRIN) 800 MG tablet   Oral   Take 1 tablet (800 mg total) by mouth every 8 (eight) hours as needed.   24 tablet   0   . ibuprofen (ADVIL,MOTRIN) 800 MG tablet   Oral   Take 1 tablet (800 mg total) by mouth every 8 (eight) hours as needed.   30 tablet   0   . loratadine (CLARITIN) 10 MG tablet   Oral   Take 1 tablet (10 mg total) by mouth daily.   30 tablet   0   . oxymetazoline (AFRIN NASAL SPRAY) 0.05 % nasal spray   Each Nare   Place 1 spray into both nostrils 2 (two) times  daily. For three days   30 mL   0   . penicillin v potassium (VEETID) 500 MG tablet   Oral   Take 1 tablet (500 mg total) by mouth 4 (four) times daily.   24 tablet   0   . predniSONE (DELTASONE) 20 MG tablet   Oral   Take 2 tablets (40 mg total) by mouth daily with breakfast.   8 tablet   0   . Prenatal Vit-Fe Fumarate-FA (PRENATAL MULTIVITAMIN) TABS tablet   Oral   Take 1 tablet by mouth daily at 12 noon.   30 tablet   0     Allergies Review of patient's allergies indicates no known allergies.  No family history on file.  Social History Social History  Substance Use Topics  . Smoking status: Former Smoker -- 0.25 packs/day    Quit date: 09/13/2015  . Smokeless tobacco: None  . Alcohol Use: Yes     Comment: occasionaly    Review of Systems Constitutional: No fever/chills Eyes: No visual changes. ENT: No sore throat. Cardiovascular: Denies chest pain. Respiratory: Shortness of breath or "gets winded" at work Gastrointestinal: No abdominal pain.  No nausea, no vomiting.  No diarrhea.  No constipation. Genitourinary:  Negative for dysuria. Musculoskeletal: Negative for back pain. Skin: Negative for rash. Neurological: Negative for headaches, focal weakness or numbness.  Lightheadedness at work  10-point ROS otherwise negative.  ____________________________________________   PHYSICAL EXAM:  VITAL SIGNS: ED Triage Vitals  Enc Vitals Group     BP 09/23/15 2006 132/108 mmHg     Pulse Rate 09/23/15 2006 75     Resp 09/23/15 2006 20     Temp 09/23/15 2006 98.3 F (36.8 C)     Temp Source 09/23/15 2006 Oral     SpO2 09/23/15 2006 100 %     Weight 09/23/15 2006 180 lb (81.647 kg)     Height 09/23/15 2006 5\' 1"  (1.549 m)     Head Cir --      Peak Flow --      Pain Score 09/23/15 2008 0     Pain Loc --      Pain Edu? --      Excl. in GC? --     Constitutional: Alert and oriented. Well appearing and in no acute distress. Eyes: Conjunctivae are normal.  PERRL. EOMI. Head: Atraumatic. Mouth/Throat: Mucous membranes are moist.  Oropharynx non-erythematous. Cardiovascular: Normal rate, regular rhythm. Grossly normal heart sounds.  Good peripheral circulation. Respiratory: Normal respiratory effort.  No retractions. Lungs CTAB. Gastrointestinal: Soft and nontender. No distention. No abdominal bruits. No CVA tenderness. Genitourinary: Deferred Musculoskeletal: No lower extremity tenderness nor edema.  No joint effusions. Neurologic:  Normal speech and language. No gross focal neurologic deficits are appreciated.  Skin:  Skin is warm, dry and intact. No rash noted. Psychiatric: Mood and affect are normal. Speech and behavior are normal.  ____________________________________________   LABS (all labs ordered are listed, but only abnormal results are displayed)  Labs Reviewed  CBC - Abnormal; Notable for the following:    MCV 79.4 (*)    All other components within normal limits  COMPREHENSIVE METABOLIC PANEL - Abnormal; Notable for the following:    ALT 12 (*)    All other components within normal limits  URINALYSIS COMPLETEWITH MICROSCOPIC ([facility] ONLY) - Abnormal; Notable for the following:    Color, Urine YELLOW (*)    APPearance HAZY (*)    Leukocytes, UA 3+ (*)    Bacteria, UA FEW (*)    Squamous Epithelial / LPF 6-30 (*)    All other components within normal limits  POCT PREGNANCY, URINE - Abnormal; Notable for the following:    Preg Test, Ur POSITIVE (*)    All other components within normal limits  URINE CULTURE  POC URINE PREG, ED   ____________________________________________  EKG  Not indicated ____________________________________________  RADIOLOGY   No results found.  ____________________________________________   PROCEDURES  Procedure(s) performed: None  Critical Care performed: No ____________________________________________   INITIAL IMPRESSION / ASSESSMENT AND PLAN / ED COURSE  Pertinent labs &  imaging results that were available during my care of the patient were reviewed by me and considered in my medical decision making (see chart for details).  Patient is well-appearing with normal vitals in no acute distress.  I explained to her that she needs to follow up with her OB when the time as appropriate and start taking prenatal vitamins.  I encouraged her to drink plenty of clear fluids.  There is no sign of infection or serious illness at this time.  UA w/ possible UTI.  Since pregnant, I will treat empirically and send culture.  ____________________________________________  FINAL CLINICAL IMPRESSION(S) / ED DIAGNOSES  Final diagnoses:  Early stage of pregnancy  Lightheadedness  UTI in pregnancy, first trimester      NEW MEDICATIONS STARTED DURING THIS VISIT:  New Prescriptions   CEPHALEXIN (KEFLEX) 500 MG CAPSULE    Take 1 capsule (500 mg total) by mouth 2 (two) times daily.   PRENATAL VIT-FE FUMARATE-FA (PRENATAL MULTIVITAMIN) TABS TABLET    Take 1 tablet by mouth daily at 12 noon.     Loleta Rose, MD 09/23/15 2227

## 2015-09-23 NOTE — ED Notes (Signed)
Patient found out she was pregnant a few days ago. 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 Works at [employer]. Patient able to speak in complete sentences without difficulty while texting on her phone during triage.

## 2015-09-26 LAB — URINE CULTURE: Special Requests: NORMAL

## 2015-09-30 ENCOUNTER — Encounter: Payer: Self-pay | Admitting: Emergency Medicine

## 2015-09-30 ENCOUNTER — Emergency Department
Admission: EM | Admit: 2015-09-30 | Discharge: 2015-09-30 | Disposition: A | Discharge status: Home / Self Care | Payer: Self-pay | Attending: Emergency Medicine | Admitting: Emergency Medicine

## 2015-09-30 DIAGNOSIS — R110 Nausea: Secondary | ICD-10-CM | POA: Insufficient documentation

## 2015-09-30 DIAGNOSIS — O99281 Endocrine, nutritional and metabolic diseases complicating pregnancy, first trimester: Secondary | ICD-10-CM | POA: Insufficient documentation

## 2015-09-30 DIAGNOSIS — O9989 Other specified diseases and conditions complicating pregnancy, childbirth and the puerperium: Secondary | ICD-10-CM | POA: Insufficient documentation

## 2015-09-30 DIAGNOSIS — R42 Dizziness and giddiness: Secondary | ICD-10-CM | POA: Insufficient documentation

## 2015-09-30 DIAGNOSIS — Z3401 Encounter for supervision of normal first pregnancy, first trimester: Secondary | ICD-10-CM

## 2015-09-30 DIAGNOSIS — Z792 Long term (current) use of antibiotics: Secondary | ICD-10-CM | POA: Insufficient documentation

## 2015-09-30 DIAGNOSIS — Z3A09 9 weeks gestation of pregnancy: Secondary | ICD-10-CM | POA: Insufficient documentation

## 2015-09-30 DIAGNOSIS — Z7952 Long term (current) use of systemic steroids: Secondary | ICD-10-CM | POA: Insufficient documentation

## 2015-09-30 DIAGNOSIS — Z87891 Personal history of nicotine dependence: Secondary | ICD-10-CM | POA: Insufficient documentation

## 2015-09-30 DIAGNOSIS — Z79899 Other long term (current) drug therapy: Secondary | ICD-10-CM | POA: Insufficient documentation

## 2015-09-30 DIAGNOSIS — E663 Overweight: Secondary | ICD-10-CM | POA: Insufficient documentation

## 2015-09-30 LAB — CBC
HCT: 35.6 % (ref 35.0–47.0)
Hemoglobin: 11.7 g/dL — ABNORMAL LOW (ref 12.0–16.0)
MCH: 26.2 pg (ref 26.0–34.0)
MCHC: 33 g/dL (ref 32.0–36.0)
MCV: 79.5 fL — ABNORMAL LOW (ref 80.0–100.0)
PLATELETS: 329 10*3/uL (ref 150–440)
RBC: 4.47 MIL/uL (ref 3.80–5.20)
RDW: 13.8 % (ref 11.5–14.5)
WBC: 11.7 10*3/uL — AB (ref 3.6–11.0)

## 2015-09-30 LAB — BASIC METABOLIC PANEL
Anion gap: 7 (ref 5–15)
BUN: 13 mg/dL (ref 6–20)
CHLORIDE: 100 mmol/L — AB (ref 101–111)
CO2: 26 mmol/L (ref 22–32)
Calcium: 9.4 mg/dL (ref 8.9–10.3)
Creatinine, Ser: 0.86 mg/dL (ref 0.44–1.00)
GFR calc Af Amer: >60 mL/min (ref >60)
GFR calc non Af Amer: >60 mL/min (ref >60)
Glucose, Bld: 90 mg/dL (ref 65–99)
POTASSIUM: 4 mmol/L (ref 3.5–5.1)
Sodium: 133 mmol/L — ABNORMAL LOW (ref 135–145)

## 2015-09-30 LAB — URINALYSIS COMPLETE WITH MICROSCOPIC (ARMC ONLY)
Bilirubin Urine: NEGATIVE
Glucose, UA: NEGATIVE mg/dL
KETONES UR: NEGATIVE mg/dL
Nitrite: NEGATIVE
PH: 6 (ref 5.0–8.0)
PROTEIN: NEGATIVE mg/dL
Specific Gravity, Urine: 1.012 (ref 1.005–1.030)

## 2015-09-30 LAB — GLUCOSE, CAPILLARY: GLUCOSE-CAPILLARY: 76 mg/dL (ref 65–99)

## 2015-09-30 LAB — POCT PREGNANCY, URINE: PREG TEST UR: POSITIVE — AB

## 2015-09-30 MED ORDER — ONDANSETRON 4 MG PO TBDP
4.0000 mg | ORAL_TABLET | Freq: Once | ORAL | Status: AC
  Administered 2015-09-30: 4 mg via ORAL

## 2015-09-30 MED ORDER — SODIUM CHLORIDE 0.9 % IV BOLUS (SEPSIS)
1000.0000 mL | Freq: Once | INTRAVENOUS | Status: AC
Start: 2015-09-30 — End: 2015-09-30
  Administered 2015-09-30: 1000 mL via INTRAVENOUS

## 2015-09-30 MED ORDER — ONDANSETRON 4 MG PO TBDP
ORAL_TABLET | ORAL | Status: AC
  Administered 2015-09-30: 4 mg via ORAL
  Filled 2015-09-30: qty 1

## 2015-09-30 NOTE — ED Notes (Signed)
Pt complains of feeling lightheaded for 4 days. Pt states she was seen here two days ago and was told she had high BP. Pt states she recently found out she was pregnant. Pt denies headaches, complains of nausea but denies vomiting.

## 2015-09-30 NOTE — ED Notes (Signed)
Pt reports dizziness for the past 4 days. When asked if pt has been drinking enough fluids to keep urine clear to pale yellow pt reports " I think it might be my fluids" Pt reports being pregnant with her 4th child. Pt does report intermittent mild nausea stating "I just feel queasy and dizzy and then my stomach feels a little upset, and I don't fell like drinking".  Pt denies knowing anything other than not drinking enough fluids that could be making her dizzy.

## 2015-09-30 NOTE — ED Provider Notes (Signed)
[facility] Emergency Department Provider Note  ____________________________________________  Time seen: Approximately 8:43 PM  I have reviewed the triage vital signs and the nursing notes.   HISTORY  Chief Complaint Dizziness    HPI Brianna Juarez is a 19 y.o. female G3 P2 approximately [redacted] weeks pregnant by LMP presenting for lightheadedness. Patient reports that over last several days she has had a lightheadedness. She has episodes that last proximal in 10-15 minutes and then resolve if she drinks liquid. These episodes are worse if she has been standing for long time. She also has been skipping meals she is concerned about her obesity being bagged for the pregnancy. She denies any cough or cold symptoms, fever, chest pain or shortness of breath, calf pain, abdominal pain, vaginal bleeding, or vaginal discharge. No fevers or chills.Patient has not yet established OB care due to insurance issues.   History reviewed. No pertinent past medical history.  There are no active problems to display for this patient.   Past Surgical History  Procedure Laterality Date  . Therapeutic abortion      Current Outpatient Rx  Name  Route  Sig  Dispense  Refill  . amoxicillin (AMOXIL) 500 MG tablet   Oral   Take 1 tablet (500 mg total) by mouth 3 (three) times daily.   21 tablet   0   . cephALEXin (KEFLEX) 500 MG capsule   Oral   Take 1 capsule (500 mg total) by mouth 2 (two) times daily.   14 capsule   0   . ibuprofen (ADVIL,MOTRIN) 800 MG tablet   Oral   Take 1 tablet (800 mg total) by mouth every 8 (eight) hours as needed.   24 tablet   0   . ibuprofen (ADVIL,MOTRIN) 800 MG tablet   Oral   Take 1 tablet (800 mg total) by mouth every 8 (eight) hours as needed.   30 tablet   0   . loratadine (CLARITIN) 10 MG tablet   Oral   Take 1 tablet (10 mg total) by mouth daily.   30 tablet   0   . oxymetazoline (AFRIN NASAL SPRAY) 0.05 % nasal spray    Each Nare   Place 1 spray into both nostrils 2 (two) times daily. For three days   30 mL   0   . penicillin v potassium (VEETID) 500 MG tablet   Oral   Take 1 tablet (500 mg total) by mouth 4 (four) times daily.   24 tablet   0   . predniSONE (DELTASONE) 20 MG tablet   Oral   Take 2 tablets (40 mg total) by mouth daily with breakfast.   8 tablet   0   . Prenatal Vit-Fe Fumarate-FA (PRENATAL MULTIVITAMIN) TABS tablet   Oral   Take 1 tablet by mouth daily at 12 noon.   30 tablet   0     Allergies Review of patient's allergies indicates no known allergies.  History reviewed. No pertinent family history.  Social History Social History  Substance Use Topics  . Smoking status: Former Smoker -- 0.25 packs/day    Quit date: 09/13/2015  . Smokeless tobacco: None  . Alcohol Use: Yes     Comment: occasionaly    Review of Systems Constitutional: No fever/chills. Positive lightheadedness. Negative syncope. Eyes: No visual changes. ENT: No sore throat. Cardiovascular: Denies chest pain, palpitations. Respiratory: Denies shortness of breath.  No cough. Gastrointestinal: No abdominal pain.  Occasional mild nausea nausea,  no vomiting.  No diarrhea.  No constipation. Genitourinary: Negative for dysuria. Musculoskeletal: Negative for back pain. Skin: Negative for rash. Neurological: Negative for headaches, focal weakness or numbness.  10-point ROS otherwise negative.  ____________________________________________   PHYSICAL EXAM:  VITAL SIGNS: ED Triage Vitals  Enc Vitals Group     BP 09/30/15 1924 111/70 mmHg     Pulse Rate 09/30/15 1924 83     Resp 09/30/15 1924 18     Temp 09/30/15 1924 97.6 F (36.4 C)     Temp src --      SpO2 09/30/15 1924 100 %     Weight 09/30/15 1924 180 lb (81.647 kg)     Height 09/30/15 1924 5\' 1"  (1.549 m)     Head Cir --      Peak Flow --      Pain Score 09/30/15 1924 0     Pain Loc --      Pain Edu? --      Excl. in GC? --      Constitutional: Alert and oriented. Well appearing and in no acute distress. Answer question appropriately. Eyes: Conjunctivae are normal.  EOMI. no scleral icterus Head: Atraumatic. Nose: No congestion/rhinnorhea. Mouth/Throat: Mucous membranes are moist.  Neck: No stridor.  Supple.  No JVD. Cardiovascular: Normal rate, regular rhythm. No murmurs, rubs or gallops.  Respiratory: Normal respiratory effort.  No retractions. Lungs CTAB.  No wheezes, rales or ronchi. Gastrointestinal: Overweight. Soft and nontender. No distention. No peritoneal signs. No palpable mass or fundus. Musculoskeletal: No LE edema.  Neurologic:  Normal speech and language. No gross focal neurologic deficits are appreciated.  Skin:  Skin is warm, dry and intact. No rash noted. Psychiatric: Mood and affect are normal. Speech and behavior are normal.  Normal judgement.  ____________________________________________   LABS (all labs ordered are listed, but only abnormal results are displayed)  Labs Reviewed  BASIC METABOLIC PANEL - Abnormal; Notable for the following:    Sodium 133 (*)    Chloride 100 (*)    All other components within normal limits  CBC - Abnormal; Notable for the following:    WBC 11.7 (*)    Hemoglobin 11.7 (*)    MCV 79.5 (*)    All other components within normal limits  URINALYSIS COMPLETEWITH MICROSCOPIC ([facility] ONLY) - Abnormal; Notable for the following:    Color, Urine YELLOW (*)    APPearance CLEAR (*)    Hgb urine dipstick 1+ (*)    Leukocytes, UA 1+ (*)    Bacteria, UA RARE (*)    Squamous Epithelial / LPF 0-5 (*)    All other components within normal limits  POCT PREGNANCY, URINE - Abnormal; Notable for the following:    Preg Test, Ur POSITIVE (*)    All other components within normal limits  GLUCOSE, CAPILLARY  CBG MONITORING, ED  POC URINE PREG, ED   ____________________________________________  EKG  ED ECG REPORT I, Rockne MenghiniNorman, Anne-Caroline, the attending physician,  personally viewed and interpreted this ECG.   Date: 09/30/2015  EKG Time: 1928  Rate: 73  Rhythm: normal sinus rhythm  Axis: Normal  Intervals:none  ST&T Change: Nonspecific T-wave inversion in V1 and V2.  ____________________________________________  RADIOLOGY  No results found.  ____________________________________________   PROCEDURES  Procedure(s) performed: None  Critical Care performed: No ____________________________________________   INITIAL IMPRESSION / ASSESSMENT AND PLAN / ED COURSE  Pertinent labs & imaging results that were available during my care of the patient were reviewed  by me and considered in my medical decision making (see chart for details).   19 y.o. G3 P2 approximately [redacted] weeks pregnant based on LMP presenting with lightheadedness that has started over last several days after she has decreased her by mouth intake due to concerns about obesity and pregnancy. Patient also reports decreased liquid intake. Patient does not have any other associated red flag symptoms. Her exam is reassuring. Her orthostatic vital signs do show that she has a mild decrease in her blood pressure from 120s to 105 with standing, but her heart rate remained stable. She will receive 1 L of IV fluid here, and understands the importance of hydration at home. We also discussed small frequent meals with healthy food to prevent hypoglycemia. She understands return precautions and follow-up instructions. She will continue her antibiotics for UTI until they're completely.  ____________________________________________  FINAL CLINICAL IMPRESSION(S) / ED DIAGNOSES  Final diagnoses:  Lightheadedness  Pregnancy, first, first trimester      NEW MEDICATIONS STARTED DURING THIS VISIT:  New Prescriptions   No medications on file     Rockne Menghini, MD 09/30/15 2047

## 2015-09-30 NOTE — ED Notes (Signed)
AAOx3.  Skin warm and dry. NAD.  Ambulates with easy and steady gait.   

## 2015-09-30 NOTE — Discharge Instructions (Signed)
Please make sure that you're drinking plenty of fluids to stay well hydrated. Please do not skip meals, any small frequent meals throughout the day to prevent low blood sugar. Next  Please make an appointment with the OB/GYN for follow-up. Next  Please return to the emergency department if he develops severe pain, fainting, numbness tingling or weakness, chest pain, shortness of breath, palpitations, vaginal bleeding or vaginal discharge, or any other symptoms concerning to you.

## 2015-11-14 NOTE — L&D Delivery Note (Signed)
Delivery Summary for Brianna Juarez  Labor Events:   Preterm labor:   Rupture date:   Rupture time:   Rupture type: Artificial  Fluid Color: Clear  Induction: Pitocin  Augmentation:   Complications: None  Cervical ripening:          Delivery:   Episiotomy:   Lacerations:   Repair suture:   Repair # of packets:   Blood loss (ml): 100   Information for the patient's newborn:  Brianna Juarez, PendingBaby [[id number]][[id number]]    Delivery 05/11/2016 1:41 AM by  Vaginal, Spontaneous Delivery Sex: Female   Gestational Age: 3430w0d Delivery Clinician:  Hildred LaserAnika Taras Juarez Living?: Yes        APGARS  One minute Five minutes Ten minutes  Skin color: 0   1      Heart rate: 2   2      Grimace: 2   2      Muscle tone: 2   2      Breathing: 2   2      Totals: 8  9      Presentation/position: Vertex  Right Occiput Anterior Resuscitation:   Cord information: 3 vessels   Disposition of cord blood: No    Blood gases sent? No Complications: None  Placenta: Delivered: 05/11/2016 1:46 AM  Spontaneous  Intact appearance Newborn Measurements: Weight: 7 lb 0.2 oz (3180 g)  Height:    Head circumference:    Chest circumference:    Other providers: Transition RN Brianna Juarez  Additional  information: Forceps:   Vacuum:   Breech:   Observed anomalies        Delivery Note At 1:41 AM a viable and healthy  Female was delivered via Vaginal, Spontaneous Delivery (Presentation: Right Occiput Anterior).  APGAR: 8, 9; weight 7 lb 0.2 oz (3180 g).   Placenta status: Intact, Spontaneous.  Cord: 3 vessels with the following complications: None.  Cord pH: not obtained  Anesthesia: Epidural  Episiotomy: None Lacerations: None Suture Repair: None Est. Blood Loss (mL): 100  Mom to postpartum.  Baby to Couplet care / Skin to Skin.  Brianna Juarez 05/11/2016, 1:59 AM

## 2015-12-01 ENCOUNTER — Encounter: Payer: Self-pay | Admitting: Emergency Medicine

## 2015-12-01 ENCOUNTER — Emergency Department
Admission: EM | Admit: 2015-12-01 | Discharge: 2015-12-01 | Disposition: A | Discharge status: Home / Self Care | Payer: Self-pay | Attending: Emergency Medicine | Admitting: Emergency Medicine

## 2015-12-01 DIAGNOSIS — Z792 Long term (current) use of antibiotics: Secondary | ICD-10-CM | POA: Insufficient documentation

## 2015-12-01 DIAGNOSIS — Z7952 Long term (current) use of systemic steroids: Secondary | ICD-10-CM | POA: Insufficient documentation

## 2015-12-01 DIAGNOSIS — Z87891 Personal history of nicotine dependence: Secondary | ICD-10-CM | POA: Insufficient documentation

## 2015-12-01 DIAGNOSIS — Z3A14 14 weeks gestation of pregnancy: Secondary | ICD-10-CM | POA: Insufficient documentation

## 2015-12-01 DIAGNOSIS — R42 Dizziness and giddiness: Secondary | ICD-10-CM | POA: Insufficient documentation

## 2015-12-01 DIAGNOSIS — R110 Nausea: Secondary | ICD-10-CM | POA: Insufficient documentation

## 2015-12-01 DIAGNOSIS — O2692 Pregnancy related conditions, unspecified, second trimester: Secondary | ICD-10-CM

## 2015-12-01 DIAGNOSIS — O9989 Other specified diseases and conditions complicating pregnancy, childbirth and the puerperium: Secondary | ICD-10-CM | POA: Insufficient documentation

## 2015-12-01 DIAGNOSIS — Z79899 Other long term (current) drug therapy: Secondary | ICD-10-CM | POA: Insufficient documentation

## 2015-12-01 DIAGNOSIS — R51 Headache: Secondary | ICD-10-CM | POA: Insufficient documentation

## 2015-12-01 LAB — BASIC METABOLIC PANEL
Anion gap: 6 (ref 5–15)
BUN: 8 mg/dL (ref 6–20)
CALCIUM: 9 mg/dL (ref 8.9–10.3)
CO2: 24 mmol/L (ref 22–32)
Chloride: 106 mmol/L (ref 101–111)
Creatinine, Ser: 0.66 mg/dL (ref 0.44–1.00)
GFR calc Af Amer: >60 mL/min (ref >60)
GFR calc non Af Amer: >60 mL/min (ref >60)
GLUCOSE: 106 mg/dL — AB (ref 65–99)
Potassium: 3.9 mmol/L (ref 3.5–5.1)
Sodium: 136 mmol/L (ref 135–145)

## 2015-12-01 LAB — CBC WITH DIFFERENTIAL/PLATELET
Basophils Absolute: 0 10*3/uL (ref 0–0.1)
Basophils Relative: 0 %
EOS PCT: 1 %
Eosinophils Absolute: 0.1 10*3/uL (ref 0–0.7)
HCT: 33.7 % — ABNORMAL LOW (ref 35.0–47.0)
Hemoglobin: 11.1 g/dL — ABNORMAL LOW (ref 12.0–16.0)
LYMPHS ABS: 2.6 10*3/uL (ref 1.0–3.6)
Lymphocytes Relative: 26 %
MCH: 26.3 pg (ref 26.0–34.0)
MCHC: 33 g/dL (ref 32.0–36.0)
MCV: 79.6 fL — AB (ref 80.0–100.0)
MONO ABS: 0.9 10*3/uL (ref 0.2–0.9)
Monocytes Relative: 9 %
Neutro Abs: 6.4 10*3/uL (ref 1.4–6.5)
Neutrophils Relative %: 64 %
PLATELETS: 296 10*3/uL (ref 150–440)
RBC: 4.23 MIL/uL (ref 3.80–5.20)
RDW: 13.1 % (ref 11.5–14.5)
WBC: 9.9 10*3/uL (ref 3.6–11.0)

## 2015-12-01 MED ORDER — PROMETHAZINE HCL 25 MG PO TABS: 25.0000 mg | ORAL_TABLET | Freq: Four times a day (QID) | ORAL | Status: DC | PRN

## 2015-12-01 MED ORDER — PRENATAL MULTIVITAMIN CH: 1.0000 | ORAL_TABLET | Freq: Every day | ORAL | Status: DC

## 2015-12-01 NOTE — ED Notes (Signed)
Pt c/o nausea and dizziness at work yesterday.  Pt denies nausea today and sts that she has not thrown up.

## 2015-12-01 NOTE — Discharge Instructions (Signed)
Nausea, Adult °Nausea is the feeling that you have an upset stomach or have to vomit. Nausea by itself is not likely a serious concern, but it may be an early sign of more serious medical problems. As nausea gets worse, it can lead to vomiting. If vomiting develops, there is the risk of dehydration.  °CAUSES  °· Viral infections. °· Food poisoning. °· Medicines. °· Pregnancy. °· Motion sickness. °· Migraine headaches. °· Emotional distress. °· Severe pain from any source. °· Alcohol intoxication. °HOME CARE INSTRUCTIONS °· Get plenty of rest. °· Ask your caregiver about specific rehydration instructions. °· Eat small amounts of food and sip liquids more often. °· Take all medicines as told by your caregiver. °SEEK MEDICAL CARE IF: °· You have not improved after 2 days, or you get worse. °· You have a headache. °SEEK IMMEDIATE MEDICAL CARE IF:  °· You have a fever. °· You faint. °· You keep vomiting or have blood in your vomit. °· You are extremely weak or dehydrated. °· You have dark or bloody stools. °· You have severe chest or abdominal pain. °MAKE SURE YOU: °· Understand these instructions. °· Will watch your condition. °· Will get help right away if you are not doing well or get worse. °  °This information is not intended to replace advice given to you by your health care provider. Make sure you discuss any questions you have with your health care provider. °  °Document Released: 12/07/2004 Document Revised: 11/20/2014 Document Reviewed: 07/12/2011 °Elsevier Interactive Patient Education ©2016 [employer] ° °

## 2015-12-01 NOTE — ED Provider Notes (Signed)
[facility] Emergency Department Provider Note ____________________________________________  Time seen: Approximately 2:11 PM  I have reviewed the triage vital signs and the nursing notes.   HISTORY  Chief Complaint Nausea   HPI Brianna Juarez is a G35P2A2 pregnant 20 y.o. female who presents with 1 episode of nausea and lightheadedness. She is [redacted] weeks pregnant by LMP. Patient reports that yesterday around 4 PM she was at work when she had a five-minute episode of nausea and lightheadedness which was relieved by sitting down. She denies diaphoresis, chest pain, abdominal pain, or vaginal bleeding, but does endorse headache.  History reviewed. No pertinent past medical history.  There are no active problems to display for this patient.   Past Surgical History  Procedure Laterality Date  . Therapeutic abortion      Current Outpatient Rx  Name  Route  Sig  Dispense  Refill  . Prenatal Vit-Fe Fumarate-FA (PRENATAL MULTIVITAMIN) TABS tablet   Oral   Take 1 tablet by mouth daily at 12 noon.   30 tablet   6   . promethazine (PHENERGAN) 25 MG tablet   Oral   Take 1 tablet (25 mg total) by mouth every 6 (six) hours as needed for nausea or vomiting.   30 tablet   0     Allergies Review of patient's allergies indicates no known allergies.  History reviewed. No pertinent family history.  Social History Social History  Substance Use Topics  . Smoking status: Former Smoker -- 0.25 packs/day    Quit date: 09/13/2015  . Smokeless tobacco: None  . Alcohol Use: Yes     Comment: occasionaly    Review of Systems Constitutional: No fever/chills Cardiovascular: Denies chest pain. No diaphoresis Respiratory: Denies shortness of breath. Gastrointestinal: Positive nausea. No abdominal pain.  no vomiting.  No diarrhea.  No constipation. Genitourinary: Negative for dysuria. Negative for hesitancy or frequency. No vaginal bleeding. Musculoskeletal:  Negative for back pain. Skin: Negative for rash. Neurological: Negative for focal weakness or numbness. Positive headache 10-point ROS otherwise negative.  ____________________________________________   PHYSICAL EXAM:  VITAL SIGNS: ED Triage Vitals  Enc Vitals Group     BP 12/01/15 1334 116/64 mmHg     Pulse Rate 12/01/15 1334 91     Resp 12/01/15 1334 18     Temp 12/01/15 1334 98.7 F (37.1 C)     Temp Source 12/01/15 1334 Oral     SpO2 12/01/15 1334 99 %     Weight 12/01/15 1334 206 lb (93.441 kg)     Height 12/01/15 1334  (1.549 m)     Head Cir --      Peak Flow --      Pain Score 12/01/15 1335 0     Pain Loc --      Pain Edu? --      Excl. in GC? --     Constitutional: Alert and oriented. Well appearing and in no acute distress. Eyes: Conjunctivae are normal. PERRL. EOMI. Head: Atraumatic. Mouth/Throat: Mucous membranes are moist.  Oropharynx non-erythematous. Cardiovascular: Normal rate, regular rhythm. Grossly normal heart sounds.  Good peripheral circulation. Respiratory: Normal respiratory effort.  No retractions. Lungs CTAB. Gastrointestinal: Soft and nontender. No distention. No abdominal bruits.  Neurologic:  Normal speech and language. No gross focal neurologic deficits are appreciated. Skin:  Skin is warm, dry and intact. No rash noted. Psychiatric: Mood and affect are normal. Speech and behavior are normal.  ____________________________________________   LABS (all labs ordered  are listed, but only abnormal results are displayed)  Labs Reviewed  CBC WITH DIFFERENTIAL/PLATELET - Abnormal; Notable for the following:    Hemoglobin 11.1 (*)    HCT 33.7 (*)    MCV 79.6 (*)    All other components within normal limits  BASIC METABOLIC PANEL - Abnormal; Notable for the following:    Glucose, Bld 106 (*)    All other components within normal limits     PROCEDURES  Procedure(s) performed: None  Critical Care performed:  No  ____________________________________________   INITIAL IMPRESSION / ASSESSMENT AND PLAN / ED COURSE  Pertinent labs & imaging results that were available during my care of the patient were reviewed by me and considered in my medical decision making (see chart for details).  Nausea secondary to pregnancy. Reassurance provided based on lab work. Rx given for prenatal vitamins and promethazine as needed. Patient follow-up with [facility] or a [facility] of patient's choice. Patient voices no other emergency medical complaints at this time. ____________________________________________   FINAL CLINICAL IMPRESSION(S) / ED DIAGNOSES  Final diagnoses:  Pregnancy related condition in second trimester  Nausea in adult      Evangeline Dakin, PA-C 12/01/15 [address] Sydnie Sigmund, PA-C 12/01/15 1513  Arnaldo Natal, MD 12/01/15 [phone]

## 2015-12-01 NOTE — ED Notes (Signed)
Pt states her LMP was in October.  Thinks she is about [redacted] wks pregnant.  States yesterday and today she has been nauseated.  Denies pain.  Has not been to [facility]

## 2016-01-04 ENCOUNTER — Encounter: Payer: Self-pay | Admitting: Obstetrics and Gynecology

## 2016-01-05 ENCOUNTER — Encounter: Payer: Self-pay | Admitting: Obstetrics and Gynecology

## 2016-01-07 ENCOUNTER — Emergency Department
Admission: EM | Admit: 2016-01-07 | Discharge: 2016-01-07 | Disposition: A | Discharge status: Home / Self Care | Payer: Self-pay | Attending: Emergency Medicine | Admitting: Emergency Medicine

## 2016-01-07 ENCOUNTER — Encounter: Payer: Self-pay | Admitting: *Deleted

## 2016-01-07 DIAGNOSIS — O2342 Unspecified infection of urinary tract in pregnancy, second trimester: Secondary | ICD-10-CM | POA: Insufficient documentation

## 2016-01-07 DIAGNOSIS — R112 Nausea with vomiting, unspecified: Secondary | ICD-10-CM

## 2016-01-07 DIAGNOSIS — O9989 Other specified diseases and conditions complicating pregnancy, childbirth and the puerperium: Secondary | ICD-10-CM | POA: Insufficient documentation

## 2016-01-07 DIAGNOSIS — N390 Urinary tract infection, site not specified: Secondary | ICD-10-CM

## 2016-01-07 DIAGNOSIS — Z87891 Personal history of nicotine dependence: Secondary | ICD-10-CM | POA: Insufficient documentation

## 2016-01-07 DIAGNOSIS — R000 Tachycardia, unspecified: Secondary | ICD-10-CM | POA: Insufficient documentation

## 2016-01-07 DIAGNOSIS — O210 Mild hyperemesis gravidarum: Secondary | ICD-10-CM | POA: Insufficient documentation

## 2016-01-07 DIAGNOSIS — R197 Diarrhea, unspecified: Secondary | ICD-10-CM | POA: Insufficient documentation

## 2016-01-07 DIAGNOSIS — Z79899 Other long term (current) drug therapy: Secondary | ICD-10-CM | POA: Insufficient documentation

## 2016-01-07 DIAGNOSIS — Z3A19 19 weeks gestation of pregnancy: Secondary | ICD-10-CM | POA: Insufficient documentation

## 2016-01-07 LAB — CBC
HCT: 35.4 % (ref 35.0–47.0)
Hemoglobin: 11.9 g/dL — ABNORMAL LOW (ref 12.0–16.0)
MCH: 26.7 pg (ref 26.0–34.0)
MCHC: 33.6 g/dL (ref 32.0–36.0)
MCV: 79.3 fL — ABNORMAL LOW (ref 80.0–100.0)
PLATELETS: 271 10*3/uL (ref 150–440)
RBC: 4.47 MIL/uL (ref 3.80–5.20)
RDW: 13.9 % (ref 11.5–14.5)
WBC: 10.8 10*3/uL (ref 3.6–11.0)

## 2016-01-07 LAB — COMPREHENSIVE METABOLIC PANEL
ALT: 12 U/L — AB (ref 14–54)
AST: 18 U/L (ref 15–41)
Albumin: 3.5 g/dL (ref 3.5–5.0)
Alkaline Phosphatase: 91 U/L (ref 38–126)
Anion gap: 9 (ref 5–15)
BUN: 10 mg/dL (ref 6–20)
CALCIUM: 9 mg/dL (ref 8.9–10.3)
CO2: 23 mmol/L (ref 22–32)
CREATININE: 0.58 mg/dL (ref 0.44–1.00)
Chloride: 105 mmol/L (ref 101–111)
Glucose, Bld: 103 mg/dL — ABNORMAL HIGH (ref 65–99)
Potassium: 3.7 mmol/L (ref 3.5–5.1)
Sodium: 137 mmol/L (ref 135–145)
TOTAL PROTEIN: 7.7 g/dL (ref 6.5–8.1)
Total Bilirubin: 0.4 mg/dL (ref 0.3–1.2)

## 2016-01-07 LAB — URINALYSIS COMPLETE WITH MICROSCOPIC (ARMC ONLY)
BILIRUBIN URINE: NEGATIVE
Bacteria, UA: NONE SEEN
Glucose, UA: NEGATIVE mg/dL
HGB URINE DIPSTICK: NEGATIVE
Nitrite: NEGATIVE
PH: 5 (ref 5.0–8.0)
Protein, ur: 30 mg/dL — AB
Specific Gravity, Urine: 1.028 (ref 1.005–1.030)

## 2016-01-07 LAB — LIPASE, BLOOD: Lipase: 22 U/L (ref 11–51)

## 2016-01-07 MED ORDER — METOCLOPRAMIDE HCL 5 MG/ML IJ SOLN
10.0000 mg | Freq: Once | INTRAMUSCULAR | Status: AC
  Administered 2016-01-07: 10 mg via INTRAVENOUS

## 2016-01-07 MED ORDER — METOCLOPRAMIDE HCL 10 MG PO TABS
ORAL_TABLET | ORAL | Status: AC
  Administered 2016-01-07: 10 mg via ORAL
  Filled 2016-01-07: qty 1

## 2016-01-07 MED ORDER — METOCLOPRAMIDE HCL 5 MG PO TABS
5.0000 mg | ORAL_TABLET | Freq: Three times a day (TID) | ORAL | Status: DC | PRN
Start: 2016-01-07 — End: 2016-01-18

## 2016-01-07 MED ORDER — SODIUM CHLORIDE 0.9 % IV BOLUS (SEPSIS)
1000.0000 mL | Freq: Once | INTRAVENOUS | Status: AC
  Administered 2016-01-07: 1000 mL via INTRAVENOUS

## 2016-01-07 MED ORDER — METOCLOPRAMIDE HCL 5 MG/ML IJ SOLN
INTRAMUSCULAR | Status: AC
  Filled 2016-01-07: qty 2

## 2016-01-07 MED ORDER — METOCLOPRAMIDE HCL 10 MG PO TABS
10.0000 mg | ORAL_TABLET | Freq: Once | ORAL | Status: AC
  Administered 2016-01-07: 10 mg via ORAL
  Filled 2016-01-07: qty 1

## 2016-01-07 MED ORDER — NITROFURANTOIN MONOHYD MACRO 100 MG PO CAPS
100.0000 mg | ORAL_CAPSULE | Freq: Two times a day (BID) | ORAL | Status: AC
Start: 2016-01-07 — End: 2016-01-12

## 2016-01-07 NOTE — Discharge Instructions (Signed)
Please seek medical attention for any high fevers, chest pain, shortness of breath, change in behavior, persistent vomiting, bloody stool or any other new or concerning symptoms. ° ° °Pregnancy and Urinary Tract Infection °A urinary tract infection (UTI) is a bacterial infection of the urinary tract. Infection of the urinary tract can include the ureters, kidneys (pyelonephritis), bladder (cystitis), and urethra (urethritis). All pregnant women should be screened for bacteria in the urinary tract. Identifying and treating a UTI will decrease the risk of preterm labor and developing more serious infections in both the mother and baby. °CAUSES °Bacteria germs cause almost all UTIs.  °RISK FACTORS °Many factors can increase your chances of getting a UTI during pregnancy. These include: °· Having a short urethra. °· Poor toilet and hygiene habits. °· Sexual intercourse. °· Blockage of urine along the urinary tract. °· Problems with the pelvic muscles or nerves. °· Diabetes. °· Obesity. °· Bladder problems after having several children. °· Previous history of UTI. °SIGNS AND SYMPTOMS  °· Pain, burning, or a stinging feeling when urinating. °· Suddenly feeling the need to urinate right away (urgency). °· Loss of bladder control (urinary incontinence). °· Frequent urination, more than is common with pregnancy. °· Lower abdominal or back discomfort. °· Cloudy urine. °· Blood in the urine (hematuria). °· Fever.  °When the kidneys are infected, the symptoms may be: °· Back pain. °· Flank pain on the right side more so than the left. °· Fever. °· Chills. °· Nausea. °· Vomiting. °DIAGNOSIS  °A urinary tract infection is usually diagnosed through urine tests. Additional tests and procedures are sometimes done. These may include: °· Ultrasound exam of the kidneys, ureters, bladder, and urethra. °· Looking in the bladder with a lighted tube (cystoscopy). °TREATMENT °Typically, UTIs can be treated with antibiotic medicines.  °HOME  CARE INSTRUCTIONS  °· Only take over-the-counter or prescription medicines as directed by your health care provider. If you were prescribed antibiotics, take them as directed. Finish them even if you start to feel better. °· Drink enough fluids to keep your urine clear or pale yellow. °· Do not have sexual intercourse until the infection is gone and your health care provider says it is okay. °· Make sure you are tested for UTIs throughout your pregnancy. These infections often come back.  °Preventing a UTI in the Future °· Practice good toilet habits. Always wipe from front to back. Use the tissue only once. °· Do not hold your urine. Empty your bladder as soon as possible when the urge comes. °· Do not douche or use deodorant sprays. °· Wash with soap and warm water around the genital area and the anus. °· Empty your bladder before and after sexual intercourse. °· Wear underwear with a cotton crotch. °· Avoid caffeine and carbonated drinks. They can irritate the bladder. °· Drink cranberry juice or take cranberry pills. This may decrease the risk of getting a UTI. °· Do not drink alcohol. °· Keep all your appointments and tests as scheduled.  °SEEK MEDICAL CARE IF:  °· Your symptoms get worse. °· You are still having fevers 2 or more days after treatment begins. °· You have a rash. °· You feel that you are having problems with medicines prescribed. °· You have abnormal vaginal discharge. °SEEK IMMEDIATE MEDICAL CARE IF:  °· You have back or flank pain. °· You have chills. °· You have blood in your urine. °· You have nausea and vomiting. °· You have contractions of your uterus. °· You have a gush of   fluid from the vagina. °MAKE SURE YOU: °· Understand these instructions.   °· Will watch your condition.   °· Will get help right away if you are not doing well or get worse.   °  °This information is not intended to replace advice given to you by your health care provider. Make sure you discuss any questions you have  with your health care provider. °  °Document Released: 02/24/2011 Document Revised: 08/20/2013 Document Reviewed: 05/29/2013 °Elsevier Interactive Patient Education ©2016 [employer] ° °

## 2016-01-07 NOTE — ED Provider Notes (Signed)
[facility] Emergency Department Provider Note    ____________________________________________  Time seen: ~2120  I have reviewed the triage vital signs and the nursing notes.   HISTORY  Chief Complaint Nausea and Emesis   History limited by: Not Limited   HPI Brianna Juarez is a 20 y.o. female at roughly [redacted] weeks pregnant who presents to the emergency department today because of concerns for nausea and vomiting. She states this started when she woke up this morning. She has multiple episodes of nonbloody vomiting. She is also had nonbloody diarrhea. She has not had any significant abdominal pain but perhaps some mild abdominal discomfort. She has not noticed any fevers. She states she has been around a lot of sick for.    History reviewed. No pertinent past medical history.  There are no active problems to display for this patient.   Past Surgical History  Procedure Laterality Date  . Therapeutic abortion      Current Outpatient Rx  Name  Route  Sig  Dispense  Refill  . Prenatal Vit-Fe Fumarate-FA (PRENATAL MULTIVITAMIN) TABS tablet   Oral   Take 1 tablet by mouth daily at 12 noon.   30 tablet   6   . promethazine (PHENERGAN) 25 MG tablet   Oral   Take 1 tablet (25 mg total) by mouth every 6 (six) hours as needed for nausea or vomiting.   30 tablet   0     Allergies Review of patient's allergies indicates no known allergies.  History reviewed. No pertinent family history.  Social History Social History  Substance Use Topics  . Smoking status: Former Smoker -- 0.25 packs/day    Quit date: 09/13/2015  . Smokeless tobacco: None  . Alcohol Use: Yes     Comment: occasionaly    Review of Systems  Constitutional: Negative for fever. Cardiovascular: Negative for chest pain. Respiratory: Negative for shortness of breath. Gastrointestinal: Positive for vomiting and diarrhea Neurological: Negative for headaches, focal weakness  or numbness.   10-point ROS otherwise negative.  ____________________________________________   PHYSICAL EXAM:  VITAL SIGNS: ED Triage Vitals  Enc Vitals Group     BP 01/07/16 2108 103/74 mmHg     Pulse Rate 01/07/16 2108 111     Resp 01/07/16 2108 18     Temp 01/07/16 2108 98.6 F (37 C)     Temp Source 01/07/16 2108 Oral     SpO2 01/07/16 2108 98 %     Weight 01/07/16 2108 180 lb (81.647 kg)     Height 01/07/16 2108  (1.549 m)     Head Cir --      Peak Flow --      Pain Score 01/07/16 2109 5   Constitutional: Alert and oriented. Well appearing and in no distress. Eyes: Conjunctivae are normal. PERRL. Normal extraocular movements. ENT   Head: Normocephalic and atraumatic.   Nose: No congestion/rhinnorhea.   Mouth/Throat: Mucous membranes are moist.   Neck: No stridor. Hematological/Lymphatic/Immunilogical: No cervical lymphadenopathy. Cardiovascular: Tachycardic, regular rhythm.  No murmurs, rubs, or gallops. Respiratory: Normal respiratory effort without tachypnea nor retractions. Breath sounds are clear and equal bilaterally. No wheezes/rales/rhonchi. Gastrointestinal: Soft and nontender. Gravid. There is no CVA tenderness. Genitourinary: Deferred Musculoskeletal: Normal range of motion in all extremities. No joint effusions.  No lower extremity tenderness nor edema. Neurologic:  Normal speech and language. No gross focal neurologic deficits are appreciated.  Skin:  Skin is warm, dry and intact. No rash noted. Psychiatric:  Mood and affect are normal. Speech and behavior are normal. Patient exhibits appropriate insight and judgment.  ____________________________________________    LABS (pertinent positives/negatives)  Labs Reviewed  COMPREHENSIVE METABOLIC PANEL - Abnormal; Notable for the following:    Glucose, Bld 103 (*)    ALT 12 (*)    All other components within normal limits  CBC - Abnormal; Notable for the following:    Hemoglobin 11.9  (*)    MCV 79.3 (*)    All other components within normal limits  URINALYSIS COMPLETEWITH MICROSCOPIC ([facility] ONLY) - Abnormal; Notable for the following:    Color, Urine YELLOW (*)    APPearance HAZY (*)    Ketones, ur 2+ (*)    Protein, ur 30 (*)    Leukocytes, UA 2+ (*)    Squamous Epithelial / LPF 0-5 (*)    All other components within normal limits  LIPASE, BLOOD     ____________________________________________   EKG  None  ____________________________________________    RADIOLOGY  None   ____________________________________________   PROCEDURES  Procedure(s) performed: None  Critical Care performed: No  ____________________________________________   INITIAL IMPRESSION / ASSESSMENT AND PLAN / ED COURSE  Pertinent labs & imaging results that were available during my care of the patient were reviewed by me and considered in my medical decision making (see chart for details).  Patient presented to the emergency department today because of concerns for nausea and vomiting. No concerning findings on physical exam. Patient without any significant abdominal pain. Blood work without any concerning findings. Urine was concerning for urinary tract infection I did discuss this with patient will plan on giving patient antiemetics as well as anabiotic's. Discussed with patient importance of following up with OB/GYN.  ____________________________________________   FINAL CLINICAL IMPRESSION(S) / ED DIAGNOSES  Final diagnoses:  Nausea and vomiting, vomiting of unspecified type  UTI (lower urinary tract infection)     Phineas Semen, MD 01/07/16 2245

## 2016-01-07 NOTE — ED Notes (Addendum)
States nausea and vomiting with loose stools since she woke up this AM, states she is pregnet but not sure how far along

## 2016-01-07 NOTE — ED Notes (Signed)
4 oz ginger ale given.  Patient states "I am feeling better".  Continue to monitor.

## 2016-01-15 DIAGNOSIS — O99712 Diseases of the skin and subcutaneous tissue complicating pregnancy, second trimester: Secondary | ICD-10-CM | POA: Diagnosis not present

## 2016-01-15 DIAGNOSIS — Z3A21 21 weeks gestation of pregnancy: Secondary | ICD-10-CM | POA: Diagnosis not present

## 2016-01-15 DIAGNOSIS — Z87891 Personal history of nicotine dependence: Secondary | ICD-10-CM | POA: Diagnosis not present

## 2016-01-15 DIAGNOSIS — O9989 Other specified diseases and conditions complicating pregnancy, childbirth and the puerperium: Secondary | ICD-10-CM | POA: Diagnosis present

## 2016-01-15 DIAGNOSIS — L02415 Cutaneous abscess of right lower limb: Secondary | ICD-10-CM | POA: Diagnosis not present

## 2016-01-15 DIAGNOSIS — L03115 Cellulitis of right lower limb: Secondary | ICD-10-CM | POA: Diagnosis not present

## 2016-01-15 DIAGNOSIS — Z79899 Other long term (current) drug therapy: Secondary | ICD-10-CM | POA: Insufficient documentation

## 2016-01-15 NOTE — ED Notes (Signed)
Patient reports noted area to right hip, ? Bug bite. For 2 days.

## 2016-01-16 ENCOUNTER — Emergency Department
Admission: EM | Admit: 2016-01-16 | Discharge: 2016-01-16 | Disposition: A | Discharge status: Home / Self Care | Payer: Medicaid Other | Attending: Emergency Medicine | Admitting: Emergency Medicine

## 2016-01-16 DIAGNOSIS — L0291 Cutaneous abscess, unspecified: Secondary | ICD-10-CM

## 2016-01-16 MED ORDER — LIDOCAINE HCL (PF) 1 % IJ SOLN
INTRAMUSCULAR | Status: AC
  Filled 2016-01-16: qty 5

## 2016-01-16 MED ORDER — ACETAMINOPHEN 500 MG PO TABS
1000.0000 mg | ORAL_TABLET | Freq: Once | ORAL | Status: AC
  Administered 2016-01-16: 1000 mg via ORAL

## 2016-01-16 MED ORDER — ACETAMINOPHEN 500 MG PO TABS
ORAL_TABLET | ORAL | Status: AC
  Administered 2016-01-16: 1000 mg via ORAL
  Filled 2016-01-16: qty 2

## 2016-01-16 NOTE — Discharge Instructions (Signed)
You have been seen in the Emergency Department (ED) today for an abscess.  This was drained in the ED.  Since we drained the infection, you do not need antibiotics at this time.  Read through the additional discharge instructions included below regarding wound care recommendations.  Keep the wound clean and dry, though you may wash as you would normally.  Change the dressing twice daily.  Call your doctor sooner or return to the ED if you develop worsening signs of infection such as: increased redness, increased pain, pus, or fever.   Abscess An abscess is an infected area that contains a collection of pus and debris.It can occur in almost any part of the body. An abscess is also known as a furuncle or boil. CAUSES  An abscess occurs when tissue gets infected. This can occur from blockage of oil or sweat glands, infection of hair follicles, or a minor injury to the skin. As the body tries to fight the infection, pus collects in the area and creates pressure under the skin. This pressure causes pain. People with weakened immune systems have difficulty fighting infections and get certain abscesses more often.  SYMPTOMS Usually an abscess develops on the skin and becomes a painful mass that is red, warm, and tender. If the abscess forms under the skin, you may feel a moveable soft area under the skin. Some abscesses break open (rupture) on their own, but most will continue to get worse without care. The infection can spread deeper into the body and eventually into the bloodstream, causing you to feel ill.  DIAGNOSIS  Your caregiver will take your medical history and perform a physical exam. A sample of fluid may also be taken from the abscess to determine what is causing your infection. TREATMENT  Your caregiver may prescribe antibiotic medicines to fight the infection. However, taking antibiotics alone usually does not cure an abscess. Your caregiver may need to make a small cut (incision) in the  abscess to drain the pus. In some cases, gauze is packed into the abscess to reduce pain and to continue draining the area. HOME CARE INSTRUCTIONS   Only take over-the-counter or prescription medicines for pain, discomfort, or fever as directed by your caregiver.  If you were prescribed antibiotics, take them as directed. Finish them even if you start to feel better.  If gauze is used, follow your caregiver's directions for changing the gauze.  To avoid spreading the infection:  Keep your draining abscess covered with a bandage.  Wash your hands well.  Do not share personal care items, towels, or whirlpools with others.  Avoid skin contact with others.  Keep your skin and clothes clean around the abscess.  Keep all follow-up appointments as directed by your caregiver. SEEK MEDICAL CARE IF:   You have increased pain, swelling, redness, fluid drainage, or bleeding.  You have muscle aches, chills, or a general ill feeling.  You have a fever. MAKE SURE YOU:   Understand these instructions.  Will watch your condition.  Will get help right away if you are not doing well or get worse. Document Released: 08/09/2005 Document Revised: 04/30/2012 Document Reviewed: 01/12/2012 [facility] Patient Information 2015 [place], [place]. This information is not intended to replace advice given to you by your health care provider. Make sure you discuss any questions you have with your health care provider.  Abscess Care After An abscess (also called a boil or furuncle) is an infected area that contains a collection of pus. Signs and  symptoms of an abscess include pain, tenderness, redness, or hardness, or you may feel a moveable soft area under your skin. An abscess can occur anywhere in the body. The infection may spread to surrounding tissues causing cellulitis. A cut (incision) by the surgeon was made over your abscess and the pus was drained out. Gauze may have been packed into the space to  provide a drain that will allow the cavity to heal from the inside outwards. The boil may be painful for 5 to 7 days. Most people with a boil do not have high fevers. Your abscess, if seen early, may not have localized, and may not have been lanced. If not, another appointment may be required for this if it does not get better on its own or with medications. HOME CARE INSTRUCTIONS   Only take over-the-counter or prescription medicines for pain, discomfort, or fever as directed by your caregiver.  When you bathe, soak and then remove gauze or iodoform packs at least daily or as directed by your caregiver. You may then wash the wound gently with mild soapy water. Repack with gauze or do as your caregiver directs. SEEK IMMEDIATE MEDICAL CARE IF:   You develop increased pain, swelling, redness, drainage, or bleeding in the wound site.  You develop signs of generalized infection including muscle aches, chills, fever, or a general ill feeling.  An oral temperature above 102 F (38.9 C) develops, not controlled by medication. See your caregiver for a recheck if you develop any of the symptoms described above. If medications (antibiotics) were prescribed, take them as directed. Document Released: 05/18/2005 Document Revised: 01/22/2012 Document Reviewed: 01/13/2008 [facility] Patient Information 2015 [place], [place]. This information is not intended to replace advice given to you by your health care provider. Make sure you discuss any questions you have with your health care provider.  Cellulitis Cellulitis is an infection of the skin and the tissue beneath it. The infected area is usually red and tender. Cellulitis occurs most often in the arms and lower legs.  CAUSES  Cellulitis is caused by bacteria that enter the skin through cracks or cuts in the skin. The most common types of bacteria that cause cellulitis are staphylococci and streptococci. SIGNS AND SYMPTOMS   Redness and  warmth.  Swelling.  Tenderness or pain.  Fever. DIAGNOSIS  Your health care provider can usually determine what is wrong based on a physical exam. Blood tests may also be done. TREATMENT  Treatment usually involves taking an antibiotic medicine. HOME CARE INSTRUCTIONS   Take your antibiotic medicine as directed by your health care provider. Finish the antibiotic even if you start to feel better.  Keep the infected arm or leg elevated to reduce swelling.  Apply a warm cloth to the affected area up to 4 times per day to relieve pain.  Take medicines only as directed by your health care provider.  Keep all follow-up visits as directed by your health care provider. SEEK MEDICAL CARE IF:   You notice red streaks coming from the infected area.  Your red area gets larger or turns dark in color.  Your bone or joint underneath the infected area becomes painful after the skin has healed.  Your infection returns in the same area or another area.  You notice a swollen bump in the infected area.  You develop new symptoms.  You have a fever. SEEK IMMEDIATE MEDICAL CARE IF:   You feel very sleepy.  You develop vomiting or diarrhea.  You have  a general ill feeling (malaise) with muscle aches and pains. MAKE SURE YOU:   Understand these instructions.  Will watch your condition.  Will get help right away if you are not doing well or get worse. Document Released: 08/09/2005 Document Revised: 03/16/2014 Document Reviewed: 01/15/2012 [facility] Patient Information 2015 [place], [place]. This information is not intended to replace advice given to you by your health care provider. Make sure you discuss any questions you have with your health care provider.

## 2016-01-16 NOTE — ED Provider Notes (Signed)
[facility] Emergency Department Provider Note  ____________________________________________  Time seen: Approximately 12:48 AM  I have reviewed the triage vital signs and the nursing notes.   HISTORY  Chief Complaint Insect Bite    HPI Brianna Juarez is a 20 y.o. female who is approximately [redacted] weeks pregnant and has no other significant medical problems who presents for evaluation of a bug bite that has been present for about 2 days.  It was gradual in onset and started as a small dot but now has gotten a little bit larger.  It is on her right hip.  There is a small central area that is white and then several centimeters of surrounding redness.  It is not itchy but it is mildly painful to the touch.  It is getting gradually worse over time.  Nothing makes it better.  She denies fever/chills, chest pain, shortness of breath, abdominal pain, nausea, vomiting.She has never had similar lesions in the past.   No past medical history on file.  There are no active problems to display for this patient.   Past Surgical History  Procedure Laterality Date  . Therapeutic abortion      Current Outpatient Rx  Name  Route  Sig  Dispense  Refill  . metoCLOPramide (REGLAN) 5 MG tablet   Oral   Take 1 tablet (5 mg total) by mouth every 8 (eight) hours as needed for nausea or vomiting.   20 tablet   0   . Prenatal Vit-Fe Fumarate-FA (PRENATAL MULTIVITAMIN) TABS tablet   Oral   Take 1 tablet by mouth daily at 12 noon.   30 tablet   6   . promethazine (PHENERGAN) 25 MG tablet   Oral   Take 1 tablet (25 mg total) by mouth every 6 (six) hours as needed for nausea or vomiting.   30 tablet   0     Allergies Review of patient's allergies indicates no known allergies.  No family history on file.  Social History Social History  Substance Use Topics  . Smoking status: Former Smoker -- 0.25 packs/day    Quit date: 09/13/2015  . Smokeless tobacco: Not on  file  . Alcohol Use: Yes     Comment: occasionaly    Review of Systems Constitutional: No fever/chills Cardiovascular: Denies chest pain. Respiratory: Denies shortness of breath. Gastrointestinal: No abdominal pain.  No nausea, no vomiting.  No diarrhea.  No constipation. Genitourinary: Negative for dysuria. No vaginal bleeding Skin: Lesion on right hip Neurological: Negative for headaches, focal weakness or numbness.   ____________________________________________   PHYSICAL EXAM:  VITAL SIGNS: ED Triage Vitals  Enc Vitals Group     BP 01/15/16 2220 133/74 mmHg     Pulse Rate 01/15/16 2220 79     Resp 01/15/16 2220 18     Temp 01/15/16 2220 97.8 F (36.6 C)     Temp Source 01/15/16 2220 Oral     SpO2 01/15/16 2220 100 %     Weight 01/15/16 2220 217 lb (98.431 kg)     Height 01/15/16 2220 5\' 2"  (1.575 m)     Head Cir --      Peak Flow --      Pain Score --      Pain Loc --      Pain Edu? --      Excl. in GC? --     Constitutional: Alert and oriented. Well appearing and in no acute distress. Eyes: Conjunctivae are normal.  PERRL. EOMI. Head: Atraumatic. Cardiovascular: Normal rate, regular rhythm. Grossly normal heart sounds.  Good peripheral circulation. Respiratory: Normal respiratory effort.  No retractions. Lungs CTAB. Gastrointestinal: Soft and nontender. No distention. No abdominal bruits. No CVA tenderness. Skin:  Skin is warm, dry and intact.  On her right hip she has a central area of induration with a head that is approximately 1 cm in diameter.  There is mild surrounding cellulitis in an area 6 cm x 3 cm. Psychiatric: Mood and affect are normal. Speech and behavior are normal.  ____________________________________________   LABS (all labs ordered are listed, but only abnormal results are displayed)  Labs Reviewed - No data to  display ____________________________________________  EKG  None ____________________________________________  RADIOLOGY   No results found.  ____________________________________________   PROCEDURES  Procedure(s) performed: I&D, see procedure note(s).  INCISION AND DRAINAGE Performed by: Loleta Rose Consent: Verbal consent obtained. Risks and benefits: risks, benefits and alternatives were discussed Type: abscess  Body area: R hip  Anesthesia: local infiltration  Incision was made with a scalpel.  Local anesthetic: lidocaine 1% without epinephrine  Anesthetic total: 1 ml  Complexity: complex Blunt dissection to break up loculations  Drainage: purulent  Drainage amount: <1 mL  Packing material: none  Patient tolerance: Patient tolerated the procedure well with no immediate complications.    Critical Care performed: No ____________________________________________   INITIAL IMPRESSION / ASSESSMENT AND PLAN / ED COURSE  Pertinent labs & imaging results that were available during my care of the patient were reviewed by me and considered in my medical decision making (see chart for details).  Small abscess, minimal surrounding cellulitis.  Good return of a significant amount of yellow material, broke up loculations, no more evidence of pus with some blood drainage.  No indication for packing based on the size of the wound.  No indication for antibiotics given the relatively small area of cellulitis and the fact that I have addressed the infection.  Additionally, she is pregnant in the second trimester and I would prefer not to give unnecessary antibiotics I gave my usual and customary return precautions.     ____________________________________________  FINAL CLINICAL IMPRESSION(S) / ED DIAGNOSES  Final diagnoses:  Abscess      NEW MEDICATIONS STARTED DURING THIS VISIT:  Discharge Medication List as of 01/16/2016  2:16 AM        Note:  This  document was prepared using Dragon voice recognition software and may include unintentional dictation errors.   Loleta Rose, MD 01/16/16 [phone]

## 2016-01-18 ENCOUNTER — Encounter: Payer: Self-pay | Admitting: Obstetrics and Gynecology

## 2016-01-18 ENCOUNTER — Ambulatory Visit (INDEPENDENT_AMBULATORY_CARE_PROVIDER_SITE_OTHER): Payer: Medicaid Other | Admitting: Obstetrics and Gynecology

## 2016-01-18 VITALS — BP 98/64 | HR 97 | Wt 216.2 lb

## 2016-01-18 DIAGNOSIS — N760 Acute vaginitis: Secondary | ICD-10-CM

## 2016-01-18 DIAGNOSIS — O0930 Supervision of pregnancy with insufficient antenatal care, unspecified trimester: Secondary | ICD-10-CM

## 2016-01-18 DIAGNOSIS — Z8759 Personal history of other complications of pregnancy, childbirth and the puerperium: Secondary | ICD-10-CM

## 2016-01-18 DIAGNOSIS — Z8742 Personal history of other diseases of the female genital tract: Secondary | ICD-10-CM

## 2016-01-18 DIAGNOSIS — O0932 Supervision of pregnancy with insufficient antenatal care, second trimester: Secondary | ICD-10-CM

## 2016-01-18 DIAGNOSIS — E669 Obesity, unspecified: Secondary | ICD-10-CM

## 2016-01-18 DIAGNOSIS — Z3491 Encounter for supervision of normal pregnancy, unspecified, first trimester: Secondary | ICD-10-CM

## 2016-01-18 DIAGNOSIS — B9689 Other specified bacterial agents as the cause of diseases classified elsewhere: Secondary | ICD-10-CM

## 2016-01-18 DIAGNOSIS — A499 Bacterial infection, unspecified: Secondary | ICD-10-CM

## 2016-01-18 MED ORDER — METRONIDAZOLE 500 MG PO TABS
500.0000 mg | ORAL_TABLET | Freq: Two times a day (BID) | ORAL | Status: DC
Start: 2016-01-18 — End: 2016-01-24

## 2016-01-18 NOTE — Progress Notes (Signed)
OBSTETRIC INITIAL PRENATAL VISIT  Subjective:    Brianna Juarez is being seen today for her first obstetrical visit.  This is not a planned pregnancy. She is a [id number] female at 5973w5d gestation, Estimated Date of Delivery: 05/04/16 by  Patient's last menstrual period was 07/29/2015 (approximate). Her obstetrical history is significant for obesity and late entry to prenatal care (states due to insurance reasons).  Had confirmation in the Emergency Room in November, no other care since that time.  Relationship with FOB: significant other, living together. Patient does intend to breast feed. Pregnancy history fully reviewed.    Obstetric History   G5   P2   T2   P0   A2   TAB0   SAB1   E0   M0   L2     # Outcome Date GA Lbr Len/2nd Weight Sex Delivery Anes PTL Lv  5 Current           4 AB 2015 7167w0d    TAB     3 Term 09/2013 5275w3d  6 lb 3 oz (2.807 kg) Genella MechM Vag-Spont EPI  Y  2 Term 2013 1582w1d  5 lb 15 oz (2.693 kg) M Vag-Spont EPI  Y  1 SAB 2012 6021w0d       ND      Gynecologic History:  Last pap smear was unsure.  Results were normal.  Denies h/o abnormal pap smears in the past.  Admits history of STIs. H/o gonorrhea in the past.    Past Medical History  Diagnosis Date  . Obesity (BMI 30-39.9)     History reviewed. No pertinent family history.   Past Surgical History  Procedure Laterality Date  . Therapeutic abortion  2015    Social History   Social History  . Marital Status: Single    Spouse Name: N/A  . Number of Children: N/A  . Years of Education: N/A   Occupational History  . Not on file.   Social History Main Topics  . Smoking status: Former Smoker -- 0.25 packs/day    Quit date: 09/13/2015  . Smokeless tobacco: Not on file  . Alcohol Use: No     Comment: occasionaly  . Drug Use: No  . Sexual Activity: Not Currently    Birth Control/ Protection: None   Other Topics Concern  . Not on file   Social History Narrative    Current Outpatient  Prescriptions on File Prior to Visit  Medication Sig Dispense Refill  . Prenatal Vit-Fe Fumarate-FA (PRENATAL MULTIVITAMIN) TABS tablet Take 1 tablet by mouth daily at 12 noon. 30 tablet 6  . [DISCONTINUED] loratadine (CLARITIN) 10 MG tablet Take 1 tablet (10 mg total) by mouth daily. 30 tablet 0   No current facility-administered medications on file prior to visit.    No Known Allergies   Review of Systems General:Not Present- Fever, Weight Loss and Weight Gain. Skin:Not Present- Rash. HEENT:Not Present- Blurred Vision, Headache and Bleeding Gums. Respiratory:Not Present- Difficulty Breathing. Breast:Not Present- Breast Mass. Cardiovascular:Not Present- Chest Pain, Elevated Blood Pressure, Fainting / Blacking Out and Shortness of Breath. Gastrointestinal:Not Present- Abdominal Pain, Constipation, Nausea and Vomiting. Female Genitourinary:Not Present- Frequency, Painful Urination, Pelvic Pain, Vaginal Bleeding, Vaginal Discharge, Contractions, regular, Fetal Movements Decreased, Urinary Complaints and Vaginal Fluid. Musculoskeletal:Not Present- Back Pain and Leg Cramps. Neurological:Not Present- Dizziness. Psychiatric:Not Present- Depression.     Objective:   Blood pressure 98/64, pulse 97, weight 216 lb 3.2 oz (98.068 kg), last menstrual period 07/29/2015.  Body mass index is 39.53 kg/(m^2).  General Appearance:    Alert, cooperative, no distress, appears stated age, moderately obese  Head:    Normocephalic, without obvious abnormality, atraumatic  Eyes:    PERRL, conjunctiva/corneas clear, EOM's intact, both eyes  Ears:    Normal external ear canals, both ears  Nose:   Nares normal, septum midline, mucosa normal, no drainage or sinus tenderness  Throat:   Lips, mucosa, and tongue normal; teeth and gums normal  Neck:   Supple, symmetrical, trachea midline, no adenopathy; thyroid: no enlargement/tenderness/nodules; no carotid bruit or JVD  Back:     Symmetric, no  curvature, ROM normal, no CVA tenderness  Lungs:     Clear to auscultation bilaterally, respirations unlabored  Chest Wall:    No tenderness or deformity   Heart:    Regular rate and rhythm, S1 and S2 normal, no murmur, rub or gallop  Breast Exam:    No tenderness, masses, or nipple abnormality  Abdomen:     Soft, non-tender, bowel sounds active all four quadrants, no masses, no organomegaly.  FH 26 (however difficult to palpate secondary to obesity).  FHT 152  bpm.  Genitalia:    Pelvic:external genitalia normal, vagina without lesions, discharge, or tenderness, rectovaginal septum  normal. Cervix normal in appearance, no cervical motion tenderness, no adnexal masses or tenderness.  Pregnancy positive findings: uterine enlargement: 28 wk size, nontender.   Rectal:    Normal external sphincter.  No hemorrhoids appreciated. Internal exam not done.   Extremities:   Extremities normal, atraumatic, no cyanosis or edema  Pulses:   2+ and symmetric all extremities  Skin:   Skin color, texture, turgor normal, no rashes or lesions  Lymph nodes:   Cervical, supraclavicular, and axillary nodes normal  Neurologic:   CNII-XII intact, normal strength, sensation and reflexes throughout      Assessment:    Pregnancy at approximately [redacted] weeks gestation, not consistent with given LMP (unless multifetal gestation present) No prenatal care H/o SGA infants Obesity Class II   Plan:   Initial labs ordered. Prenatal vitamins encouraged. Problem list reviewed and updated. New OB counseling:  The patient has been given an overview regarding routine prenatal care.  Recommendations regarding diet, weight gain, and exercise in pregnancy were given.  Patient should have no further weight gain in pregnancy (has already gained 31 lbs this pregnancy). Prenatal testing, optional genetic testing, and ultrasound use in pregnancy were reviewed.  Is beyond gestational age for traditional testing, still discussed cell-free  DNA testing.  Information given.   Will have dating/anatomy scan at next available appointment.  Will need glucola within the next week based on fundal height estimation of gestational age if 26-28 weeks.   Benefits of Breast Feeding were discussed. The patient is encouraged to consider nursing her baby post partum. Follow up in 2 weeks.  Will need Tdap and blood consents next visit if at least 28 weeks.    50% of 40 min visit spent on counseling and coordination of care.  Hildred Laser, MD [facility]

## 2016-01-18 NOTE — Patient Instructions (Signed)
Prenatal Care °WHAT IS PRENATAL CARE?  °Prenatal care is the process of caring for a pregnant woman before she gives birth. Prenatal care makes sure that she and her baby remain as healthy as possible throughout pregnancy. Prenatal care may be provided by a midwife, family practice health care provider, or a childbirth and pregnancy specialist (obstetrician). Prenatal care may include physical examinations, testing, treatments, and education on nutrition, lifestyle, and social support services. °WHY IS PRENATAL CARE SO IMPORTANT?  °Early and consistent prenatal care increases the chance that you and your baby will remain healthy throughout your pregnancy. This type of care also decreases a baby's risk of being born too early (prematurely), or being born smaller than expected (small for gestational age). Any underlying medical conditions you may have that could pose a risk during your pregnancy are discussed during prenatal care visits. You will also be monitored regularly for any new conditions that may arise during your pregnancy so they can be treated quickly and effectively. °WHAT HAPPENS DURING PRENATAL CARE VISITS? °Prenatal care visits may include the following: °Discussion °Tell your health care provider about any new signs or symptoms you have experienced since your last visit. These might include: °· Nausea or vomiting. °· Increased or decreased level of energy. °· Difficulty sleeping. °· Back or leg pain. °· Weight changes. °· Frequent urination. °· Shortness of breath with physical activity. °· Changes in your skin, such as the development of a rash or itchiness. °· Vaginal discharge or bleeding. °· Feelings of excitement or nervousness. °· Changes in your baby's movements. °You may want to write down any questions or topics you want to discuss with your health care provider and bring them with you to your appointment. °Examination °During your first prenatal care visit, you will likely have a complete  physical exam. Your health care provider will often examine your vagina, cervix, and the position of your uterus, as well as check your heart, lungs, and other body systems. As your pregnancy progresses, your health care provider will measure the size of your uterus and your baby's position inside your uterus. He or she may also examine you for early signs of labor. Your prenatal visits may also include checking your blood pressure and, after about 10-12 weeks of pregnancy, listening to your baby's heartbeat. °Testing °Regular testing often includes: °· Urinalysis. This checks your urine for glucose, protein, or signs of infection. °· Blood count. This checks the levels of white and red blood cells in your body. °· Tests for sexually transmitted infections (STIs). Testing for STIs at the beginning of pregnancy is routinely done and is required in many states. °· Antibody testing. You will be checked to see if you are immune to certain illnesses, such as rubella, that can affect a developing fetus. °· Glucose screen. Around 24-28 weeks of pregnancy, your blood glucose level will be checked for signs of gestational diabetes. Follow-up tests may be recommended. °· Group B strep. This is a bacteria that is commonly found inside a woman's vagina. This test will inform your health care provider if you need an antibiotic to reduce the amount of this bacteria in your body prior to labor and childbirth. °· Ultrasound. Many pregnant women undergo an ultrasound screening around 18-20 weeks of pregnancy to evaluate the health of the fetus and check for any developmental abnormalities. °· HIV (human immunodeficiency virus) testing. Early in your pregnancy, you will be screened for HIV. If you are at high risk for HIV, this test   may be repeated during your third trimester of pregnancy. °You may be offered other testing based on your age, personal or family medical history, or other factors.  °HOW OFTEN SHOULD I PLAN TO SEE MY  HEALTH CARE PROVIDER FOR PRENATAL CARE? °Your prenatal care check-up schedule depends on any medical conditions you have before, or develop during, your pregnancy. If you do not have any underlying medical conditions, you will likely be seen for checkups: °· Monthly, during the first 6 months of pregnancy. °· Twice a month during months 7 and 8 of pregnancy. °· Weekly starting in the 9th month of pregnancy and until delivery. °If you develop signs of early labor or other concerning signs or symptoms, you may need to see your health care provider more often. Ask your health care provider what prenatal care schedule is best for you. °WHAT CAN I DO TO KEEP MYSELF AND MY BABY AS HEALTHY AS POSSIBLE DURING MY PREGNANCY? °· Take a prenatal vitamin containing 400 micrograms (0.4 mg) of folic acid every day. Your health care provider may also ask you to take additional vitamins such as iodine, vitamin D, iron, copper, and zinc. °· Take 1500-2000 mg of calcium daily starting at your 20th week of pregnancy until you deliver your baby. °· Make sure you are up to date on your vaccinations. Unless directed otherwise by your health care provider: °¨ You should receive a tetanus, diphtheria, and pertussis (Tdap) vaccination between the 27th and 36th week of your pregnancy, regardless of when your last Tdap immunization occurred. This helps protect your baby from whooping cough (pertussis) after he or she is born. °¨ You should receive an annual inactivated influenza vaccine (IIV) to help protect you and your baby from influenza. This can be done at any point during your pregnancy. °· Eat a well-rounded diet that includes: °¨ Fresh fruits and vegetables. °¨ Lean proteins. °¨ Calcium-rich foods such as milk, yogurt, hard cheeses, and dark, leafy greens. °¨ Whole grain breads. °· Do not eat seafood high in mercury, including: °¨ Swordfish. °¨ Tilefish. °¨ Shark. °¨ King mackerel. °¨ More than 6 oz tuna per week. °· Do not eat: °¨ Raw  or undercooked meats or eggs. °¨ Unpasteurized foods, such as soft cheeses (brie, blue, or feta), juices, and milks. °¨ Lunch meats. °¨ Hot dogs that have not been heated until they are steaming. °· Drink enough water to keep your urine clear or pale yellow. For many women, this may be 10 or more 8 oz glasses of water each day. Keeping yourself hydrated helps deliver nutrients to your baby and may prevent the start of pre-term uterine contractions. °· Do not use any tobacco products including cigarettes, chewing tobacco, or electronic cigarettes. If you need help quitting, ask your health care provider. °· Do not drink beverages containing alcohol. No safe level of alcohol consumption during pregnancy has been determined. °· Do not use any illegal drugs. These can harm your developing baby or cause a miscarriage. °· Ask your health care provider or pharmacist before taking any prescription or over-the-counter medicines, herbs, or supplements. °· Limit your caffeine intake to no more than 200 mg per day. °· Exercise. Unless told otherwise by your health care provider, try to get 30 minutes of moderate exercise most days of the week. Do not  do high-impact activities, contact sports, or activities with a high risk of falling, such as horseback riding or downhill skiing. °· Get plenty of rest. °· Avoid anything that raises your   body temperature, such as hot tubs and saunas.  If you own a cat, do not empty its litter box. Bacteria contained in cat feces can cause an infection called toxoplasmosis. This can result in serious harm to the fetus.  Stay away from chemicals such as insecticides, lead, mercury, and cleaning or paint products that contain solvents.  Do not have any X-rays taken unless medically necessary.  Take a childbirth and breastfeeding preparation class. Ask your health care provider if you need a referral or recommendation.   This information is not intended to replace advice given to you by  your health care provider. Make sure you discuss any questions you have with your health care provider.   Document Released: 11/02/2003 Document Revised: 11/20/2014 Document Reviewed: 01/14/2014 Elsevier Interactive Patient Education 2016 Elsevier Inc.   Bacterial Vaginosis Bacterial vaginosis is a vaginal infection that occurs when the normal balance of bacteria in the vagina is disrupted. It results from an overgrowth of certain bacteria. This is the most common vaginal infection in women of childbearing age. Treatment is important to prevent complications, especially in pregnant women, as it can cause a premature delivery. CAUSES  Bacterial vaginosis is caused by an increase in harmful bacteria that are normally present in smaller amounts in the vagina. Several different kinds of bacteria can cause bacterial vaginosis. However, the reason that the condition develops is not fully understood. RISK FACTORS Certain activities or behaviors can put you at an increased risk of developing bacterial vaginosis, including:  Having a new sex partner or multiple sex partners.  Douching.  Using an intrauterine device (IUD) for contraception. Women do not get bacterial vaginosis from toilet seats, bedding, swimming pools, or contact with objects around them. SIGNS AND SYMPTOMS  Some women with bacterial vaginosis have no signs or symptoms. Common symptoms include:  Grey vaginal discharge.  A fishlike odor with discharge, especially after sexual intercourse.  Itching or burning of the vagina and vulva.  Burning or pain with urination. DIAGNOSIS  Your health care provider will take a medical history and examine the vagina for signs of bacterial vaginosis. A sample of vaginal fluid may be taken. Your health care provider will look at this sample under a microscope to check for bacteria and abnormal cells. A vaginal pH test may also be done.  TREATMENT  Bacterial vaginosis may be treated with  antibiotic medicines. These may be given in the form of a pill or a vaginal cream. A second round of antibiotics may be prescribed if the condition comes back after treatment. Because bacterial vaginosis increases your risk for sexually transmitted diseases, getting treated can help reduce your risk for chlamydia, gonorrhea, HIV, and herpes. HOME CARE INSTRUCTIONS   Only take over-the-counter or prescription medicines as directed by your health care provider.  If antibiotic medicine was prescribed, take it as directed. Make sure you finish it even if you start to feel better.  Tell all sexual partners that you have a vaginal infection. They should see their health care provider and be treated if they have problems, such as a mild rash or itching.  During treatment, it is important that you follow these instructions:  Avoid sexual activity or use condoms correctly.  Do not douche.  Avoid alcohol as directed by your health care provider.  Avoid breastfeeding as directed by your health care provider. SEEK MEDICAL CARE IF:   Your symptoms are not improving after 3 days of treatment.  You have increased discharge or pain.  You have a fever. MAKE SURE YOU:   Understand these instructions.  Will watch your condition.  Will get help right away if you are not doing well or get worse. FOR MORE INFORMATION  Centers for Disease Control and Prevention, Division of STD Prevention: SolutionApps.co.za American Sexual Health Association (ASHA): www.ashastd.org    This information is not intended to replace advice given to you by your health care provider. Make sure you discuss any questions you have with your health care provider.   Document Released: 10/30/2005 Document Revised: 11/20/2014 Document Reviewed: 06/11/2013 Elsevier Interactive Patient Education [employer]

## 2016-01-19 LAB — CBC WITH DIFFERENTIAL/PLATELET
BASOS: 0 %
Basophils Absolute: 0 10*3/uL (ref 0.0–0.2)
EOS (ABSOLUTE): 0.1 10*3/uL (ref 0.0–0.4)
EOS: 1 %
HEMATOCRIT: 35.1 % (ref 34.0–46.6)
HEMOGLOBIN: 11.5 g/dL (ref 11.1–15.9)
IMMATURE GRANS (ABS): 0.1 10*3/uL (ref 0.0–0.1)
Immature Granulocytes: 1 %
LYMPHS: 23 %
Lymphocytes Absolute: 2.4 10*3/uL (ref 0.7–3.1)
MCH: 26.6 pg (ref 26.6–33.0)
MCHC: 32.8 g/dL (ref 31.5–35.7)
MCV: 81 fL (ref 79–97)
MONOCYTES: 7 %
Monocytes Absolute: 0.7 10*3/uL (ref 0.1–0.9)
NEUTROS ABS: 7.4 10*3/uL — AB (ref 1.4–7.0)
Neutrophils: 68 %
Platelets: 304 10*3/uL (ref 150–379)
RBC: 4.32 x10E6/uL (ref 3.77–5.28)
RDW: 14.5 % (ref 12.3–15.4)
WBC: 10.7 10*3/uL (ref 3.4–10.8)

## 2016-01-19 LAB — HEP, RPR, HIV PANEL
HEP B S AG: NEGATIVE
HIV Screen 4th Generation wRfx: NONREACTIVE
RPR: NONREACTIVE

## 2016-01-19 LAB — MICROSCOPIC EXAMINATION
BACTERIA UA: NONE SEEN
CASTS: NONE SEEN /LPF

## 2016-01-19 LAB — URINALYSIS, ROUTINE W REFLEX MICROSCOPIC
Bilirubin, UA: NEGATIVE
Glucose, UA: NEGATIVE
Ketones, UA: NEGATIVE
NITRITE UA: NEGATIVE
PH UA: 7.5 (ref 5.0–7.5)
RBC, UA: NEGATIVE
SPEC GRAV UA: 1.023 (ref 1.005–1.030)
Urobilinogen, Ur: 1 mg/dL (ref 0.2–1.0)

## 2016-01-19 LAB — ABO AND RH: Rh Factor: POSITIVE

## 2016-01-19 LAB — SICKLE CELL SCREEN: SICKLE CELL SCREEN: NEGATIVE

## 2016-01-19 LAB — VARICELLA ZOSTER ANTIBODY, IGG: VARICELLA: 1492 {index} (ref >165)

## 2016-01-19 LAB — RUBELLA ANTIBODY, IGM

## 2016-01-20 ENCOUNTER — Other Ambulatory Visit: Payer: Medicaid Other

## 2016-01-20 ENCOUNTER — Ambulatory Visit (INDEPENDENT_AMBULATORY_CARE_PROVIDER_SITE_OTHER): Payer: Medicaid Other

## 2016-01-20 ENCOUNTER — Other Ambulatory Visit: Payer: Self-pay | Admitting: Obstetrics and Gynecology

## 2016-01-20 DIAGNOSIS — Z3491 Encounter for supervision of normal pregnancy, unspecified, first trimester: Secondary | ICD-10-CM

## 2016-01-20 LAB — CULTURE, OB URINE

## 2016-01-20 LAB — URINE CULTURE, OB REFLEX

## 2016-01-21 ENCOUNTER — Encounter: Payer: Self-pay | Admitting: Obstetrics and Gynecology

## 2016-01-21 DIAGNOSIS — E669 Obesity, unspecified: Secondary | ICD-10-CM | POA: Insufficient documentation

## 2016-01-21 DIAGNOSIS — Z8759 Personal history of other complications of pregnancy, childbirth and the puerperium: Secondary | ICD-10-CM | POA: Insufficient documentation

## 2016-01-21 DIAGNOSIS — O0930 Supervision of pregnancy with insufficient antenatal care, unspecified trimester: Secondary | ICD-10-CM | POA: Insufficient documentation

## 2016-01-21 LAB — PAP IG, CT-NG, RFX HPV ASCU
CHLAMYDIA, NUC. ACID AMP: NEGATIVE
Gonococcus by Nucleic Acid Amp: POSITIVE — AB
PAP Smear Comment: 0

## 2016-01-21 LAB — ABO/RH: RH TYPE: POSITIVE

## 2016-01-21 LAB — NUSWAB VAGINITIS PLUS (VG+)
ATOPOBIUM VAGINAE: HIGH {score} — AB
BVAB 2: HIGH {score} — AB
CANDIDA GLABRATA, NAA: NEGATIVE
Candida albicans, NAA: NEGATIVE
Chlamydia trachomatis, NAA: NEGATIVE
MEGASPHAERA 1: HIGH {score} — AB
Neisseria gonorrhoeae, NAA: POSITIVE — AB
TRICH VAG BY NAA: POSITIVE — AB

## 2016-01-21 LAB — GLUCOSE, 1 HOUR GESTATIONAL: GESTATIONAL DIABETES SCREEN: 125 mg/dL (ref 65–139)

## 2016-01-21 NOTE — Progress Notes (Signed)
Addendum: Patient with positive whiff test on pelvic exam with moderate white vaginal discharge. Suspected BV infection.  Will send NuSwab along with other NOB labs.  Will treat emperically with Flagyl 500 mg BID x 7 days.

## 2016-01-24 ENCOUNTER — Telehealth: Payer: Self-pay

## 2016-01-24 DIAGNOSIS — B9689 Other specified bacterial agents as the cause of diseases classified elsewhere: Secondary | ICD-10-CM

## 2016-01-24 DIAGNOSIS — N760 Acute vaginitis: Secondary | ICD-10-CM

## 2016-01-24 DIAGNOSIS — A5901 Trichomonal vulvovaginitis: Secondary | ICD-10-CM

## 2016-01-24 DIAGNOSIS — O98212 Gonorrhea complicating pregnancy, second trimester: Secondary | ICD-10-CM

## 2016-01-24 DIAGNOSIS — O23592 Infection of other part of genital tract in pregnancy, second trimester: Secondary | ICD-10-CM

## 2016-01-24 MED ORDER — METRONIDAZOLE 500 MG PO TABS: 500.0000 mg | ORAL_TABLET | Freq: Two times a day (BID) | ORAL | Status: DC

## 2016-01-24 MED ORDER — AZITHROMYCIN 500 MG PO TABS: 500.0000 mg | ORAL_TABLET | Freq: Once | ORAL | Status: DC

## 2016-01-24 NOTE — Telephone Encounter (Signed)
-----   Message from Hildred LaserAnika Cherry, MD sent at 01/21/2016  9:30 AM EST ----- Please inform patient of positive Gonorrhea, needs treatment with 150 mg Ceftriaxone IM, and 1 gram Azithromycin PO x 1 dose.   Also positive for trichomonas and BV infection, will also need treatment with Flagyl 500 mg BID x 7 days.  Needs to have partner tested and treated, avoid intercourse for at least 1 week after both partners treated.  Encourage safe sex practices.

## 2016-01-24 NOTE — Telephone Encounter (Signed)
Called pt no answer phone states that this phone is currently not accepting phone calls. Will call again. RX sent in.

## 2016-01-24 NOTE — Telephone Encounter (Signed)
-----   Message from Hildred LaserAnika Cherry, MD sent at 01/21/2016  9:09 AM EST ----- Normal diabetes screen.  Can inform.

## 2016-01-25 NOTE — Telephone Encounter (Signed)
Called pt. No answer, LM for pt to call back.

## 2016-01-26 NOTE — Telephone Encounter (Signed)
Called pt. No answer, unable to leave message as it states pt is currently unable to receive calls.

## 2016-01-31 NOTE — Telephone Encounter (Signed)
Pt letter mailed, as unable to contact pt via phone.

## 2016-02-17 ENCOUNTER — Encounter: Payer: Self-pay | Admitting: Obstetrics and Gynecology

## 2016-04-11 ENCOUNTER — Telehealth: Payer: Self-pay | Admitting: Obstetrics and Gynecology

## 2016-04-11 NOTE — Telephone Encounter (Signed)
This pt no showed for her appt 4/6. (4wk ROB) the Last appt she came to was 3/9. She called and wanted an appt NOW. I told her there was nothing available until 6/13 she was mad. I told her I would call when I had a cancellation. If you can see a spot let me know.

## 2016-04-12 ENCOUNTER — Encounter: Payer: Self-pay | Admitting: Obstetrics and Gynecology

## 2016-04-12 NOTE — Telephone Encounter (Signed)
Zella BallRobin, you did the right thing, the soonest I see is 04/26/16. Did the pt take the appt for 04/25/16, I don't see her on there. She needs to take that appt, and like you said we can call her if we have a cancellation. Thanks

## 2016-04-19 ENCOUNTER — Encounter: Payer: Self-pay | Admitting: Obstetrics and Gynecology

## 2016-04-19 ENCOUNTER — Ambulatory Visit (INDEPENDENT_AMBULATORY_CARE_PROVIDER_SITE_OTHER): Payer: Medicaid Other | Admitting: Obstetrics and Gynecology

## 2016-04-19 VITALS — BP 107/73 | HR 112 | Wt 230.2 lb

## 2016-04-19 DIAGNOSIS — O0933 Supervision of pregnancy with insufficient antenatal care, third trimester: Secondary | ICD-10-CM | POA: Diagnosis not present

## 2016-04-19 DIAGNOSIS — A5901 Trichomonal vulvovaginitis: Secondary | ICD-10-CM

## 2016-04-19 DIAGNOSIS — Z369 Encounter for antenatal screening, unspecified: Secondary | ICD-10-CM

## 2016-04-19 DIAGNOSIS — Z36 Encounter for antenatal screening of mother: Secondary | ICD-10-CM

## 2016-04-19 DIAGNOSIS — E669 Obesity, unspecified: Secondary | ICD-10-CM

## 2016-04-19 DIAGNOSIS — A499 Bacterial infection, unspecified: Secondary | ICD-10-CM

## 2016-04-19 DIAGNOSIS — Z131 Encounter for screening for diabetes mellitus: Secondary | ICD-10-CM

## 2016-04-19 DIAGNOSIS — B9689 Other specified bacterial agents as the cause of diseases classified elsewhere: Secondary | ICD-10-CM

## 2016-04-19 DIAGNOSIS — O23592 Infection of other part of genital tract in pregnancy, second trimester: Secondary | ICD-10-CM

## 2016-04-19 DIAGNOSIS — N760 Acute vaginitis: Secondary | ICD-10-CM

## 2016-04-19 DIAGNOSIS — O98212 Gonorrhea complicating pregnancy, second trimester: Secondary | ICD-10-CM

## 2016-04-19 DIAGNOSIS — Z113 Encounter for screening for infections with a predominantly sexual mode of transmission: Secondary | ICD-10-CM

## 2016-04-19 LAB — POCT URINALYSIS DIPSTICK
BILIRUBIN UA: NEGATIVE
Blood, UA: NEGATIVE
GLUCOSE UA: NEGATIVE
KETONES UA: NEGATIVE
Nitrite, UA: NEGATIVE
Protein, UA: NEGATIVE
SPEC GRAV UA: 1.01
UROBILINOGEN UA: NEGATIVE
pH, UA: 7

## 2016-04-19 MED ORDER — FLUCONAZOLE 150 MG PO TABS: 150.0000 mg | ORAL_TABLET | Freq: Once | ORAL | Status: DC

## 2016-04-19 MED ORDER — AZITHROMYCIN 500 MG PO TABS: 1000.0000 mg | ORAL_TABLET | Freq: Once | ORAL | Status: DC

## 2016-04-20 ENCOUNTER — Telehealth: Payer: Self-pay | Admitting: Obstetrics and Gynecology

## 2016-04-20 NOTE — Telephone Encounter (Signed)
[place] she is all confused

## 2016-04-21 ENCOUNTER — Telehealth: Payer: Self-pay | Admitting: Obstetrics and Gynecology

## 2016-04-21 NOTE — Telephone Encounter (Signed)
Called pt she states that she would like another injection of rocephin for gonorrhea. Pt states that she went against medical advise and had unprotected sex with her partner who has not been treated. Advised pt that per Dr.Cherry another injection at this point would be ineffective as it has not been enough time for the medication to work. Strongly advised pt to not engage in intercourse until she and her partner have both been treated. Pt to follow up as scheduled.

## 2016-04-21 NOTE — Telephone Encounter (Signed)
This pt called and wanted another injection. Rocephin. Please call pt

## 2016-04-21 NOTE — Telephone Encounter (Signed)
Please see previous telephone encounter.

## 2016-04-21 NOTE — Progress Notes (Signed)
ROB: Patient with no care since 24 weeks. Notes issues with Medicaid.  Informed patient of positive STD screen (Chlamydia, Trichomonas) and BV infection. Have been calling patient for several weeks and sent letter.  Patient notes that she was unaware.  Given dose of Rocephin in office, will prescribe Azithromycin for Chlamydia infection and Flagyl for Trichomonas and BV.  Counseled on safe sex practices, abstinence until both partners treated for at least 1 week. 36 week labs done today. Will need to perform glucola, however clinic currently closing.  To order for next visit. Deciding between IUD and Nexplanon for contraception.  Desires to bottle feed.  Will need Tdap and sign blood consent next visit. Discussed limiting further weigh gain.

## 2016-04-24 LAB — CULTURE, BETA STREP (GROUP B ONLY): STREP GP B CULTURE: NEGATIVE

## 2016-04-25 LAB — NUSWAB VAGINITIS PLUS (VG+)
ATOPOBIUM VAGINAE: HIGH {score} — AB
BVAB 2: HIGH {score} — AB
CANDIDA ALBICANS, NAA: NEGATIVE
CANDIDA GLABRATA, NAA: NEGATIVE
Chlamydia trachomatis, NAA: POSITIVE — AB
MEGASPHAERA 1: HIGH {score} — AB
Neisseria gonorrhoeae, NAA: NEGATIVE
Trich vag by NAA: POSITIVE — AB

## 2016-04-26 ENCOUNTER — Telehealth: Payer: Self-pay | Admitting: Obstetrics and Gynecology

## 2016-04-26 ENCOUNTER — Telehealth: Payer: Self-pay

## 2016-04-26 ENCOUNTER — Encounter: Payer: Medicaid Other | Admitting: Obstetrics and Gynecology

## 2016-04-26 ENCOUNTER — Other Ambulatory Visit: Payer: Self-pay

## 2016-04-26 DIAGNOSIS — O23592 Infection of other part of genital tract in pregnancy, second trimester: Secondary | ICD-10-CM

## 2016-04-26 DIAGNOSIS — B9689 Other specified bacterial agents as the cause of diseases classified elsewhere: Secondary | ICD-10-CM

## 2016-04-26 DIAGNOSIS — N760 Acute vaginitis: Secondary | ICD-10-CM

## 2016-04-26 DIAGNOSIS — O98212 Gonorrhea complicating pregnancy, second trimester: Secondary | ICD-10-CM

## 2016-04-26 DIAGNOSIS — A5901 Trichomonal vulvovaginitis: Secondary | ICD-10-CM

## 2016-04-26 MED ORDER — METRONIDAZOLE 500 MG PO TABS: 500.0000 mg | ORAL_TABLET | Freq: Two times a day (BID) | ORAL | Status: DC

## 2016-04-26 NOTE — Telephone Encounter (Signed)
Pt aware med has been erx.

## 2016-04-26 NOTE — Telephone Encounter (Signed)
Pt came in office for what she thought was an appt today. Per RF pt called yesterday and r/s her appt to 6/20. Pt states she did NOT r/s her appt. Applogized for the confusion. Advised pt she could wait and be seen as a w/i today. She wanted to be seen b/c she needed a shot. After further investigation, pt pas dx with gc/ct, trich,and bv.  LV pt was given Rocephin inj. She did comlpete azithromycin and diflucan as rxed last visit. Did not p/u flagyl that was rx in 01/2016. Pt has h/o cx/n/s her appts. Not seen for 2 months(01/2016-03-2016) d/t insurance issues. Pt was upset d/t having IC last week with gentleman that gave her stds. Worried about std affecting pregnancy.   [facility] I will erx flagyl since she did not take it as rxed in 01/2016. Nuswab on 6/7 pos for bv and trich. Pt wanted azithromycin and diflucan repeated. Pt aware per nuswab no y/i. AC will rx atb at [place] if pt needs. Firmly made pt aware to STOP having IC until after delivery. AW put pt on wait list if a sooner appt came available. Clerical advised to over document when speaking to this pt for whatever reason.

## 2016-04-26 NOTE — Telephone Encounter (Signed)
Patient called asking if her script for flagyl had been called in and per what I saw in her chart she was advised that it had been sent to the [facility] on [address]. She also wanted to verify that the flagyl is what she needed to take for 7 days.

## 2016-05-02 ENCOUNTER — Ambulatory Visit (INDEPENDENT_AMBULATORY_CARE_PROVIDER_SITE_OTHER): Payer: Medicaid Other | Admitting: Obstetrics and Gynecology

## 2016-05-02 VITALS — BP 113/77 | HR 90 | Wt 230.5 lb

## 2016-05-02 DIAGNOSIS — O0933 Supervision of pregnancy with insufficient antenatal care, third trimester: Secondary | ICD-10-CM

## 2016-05-02 DIAGNOSIS — O99210 Obesity complicating pregnancy, unspecified trimester: Secondary | ICD-10-CM

## 2016-05-02 DIAGNOSIS — Z3483 Encounter for supervision of other normal pregnancy, third trimester: Secondary | ICD-10-CM

## 2016-05-02 DIAGNOSIS — Z202 Contact with and (suspected) exposure to infections with a predominantly sexual mode of transmission: Secondary | ICD-10-CM

## 2016-05-02 DIAGNOSIS — Z3493 Encounter for supervision of normal pregnancy, unspecified, third trimester: Secondary | ICD-10-CM

## 2016-05-02 NOTE — Progress Notes (Signed)
ROB: Patient complains of pelvic pressure.  Occasional ctx. Reports compliance with medications for cervicitis, however does note sexual contact with infected partner approximately 2 days after last visit. Advised that since encounter was so close to treatment she should not require a repeat dosing. Will need TOC next week (likely on L&D). Will give Tdap postpartum. RTC in 1 week if undelivered.  Labor precautions discussed.

## 2016-05-09 ENCOUNTER — Other Ambulatory Visit: Payer: Self-pay | Admitting: Obstetrics and Gynecology

## 2016-05-09 ENCOUNTER — Ambulatory Visit (INDEPENDENT_AMBULATORY_CARE_PROVIDER_SITE_OTHER): Payer: Medicaid Other | Admitting: Obstetrics and Gynecology

## 2016-05-09 VITALS — BP 102/66 | HR 98 | Wt 232.4 lb

## 2016-05-09 DIAGNOSIS — A5901 Trichomonal vulvovaginitis: Secondary | ICD-10-CM

## 2016-05-09 DIAGNOSIS — Z23 Encounter for immunization: Secondary | ICD-10-CM | POA: Diagnosis not present

## 2016-05-09 DIAGNOSIS — Z3480 Encounter for supervision of other normal pregnancy, unspecified trimester: Secondary | ICD-10-CM | POA: Diagnosis not present

## 2016-05-09 DIAGNOSIS — E669 Obesity, unspecified: Secondary | ICD-10-CM

## 2016-05-09 DIAGNOSIS — O23593 Infection of other part of genital tract in pregnancy, third trimester: Secondary | ICD-10-CM

## 2016-05-09 DIAGNOSIS — Z3483 Encounter for supervision of other normal pregnancy, third trimester: Secondary | ICD-10-CM

## 2016-05-09 DIAGNOSIS — O98213 Gonorrhea complicating pregnancy, third trimester: Secondary | ICD-10-CM

## 2016-05-09 LAB — POCT URINALYSIS DIPSTICK
Bilirubin, UA: NEGATIVE
Blood, UA: NEGATIVE
Glucose, UA: NEGATIVE
Ketones, UA: NEGATIVE
LEUKOCYTES UA: NEGATIVE
Nitrite, UA: NEGATIVE
PROTEIN UA: NEGATIVE
Spec Grav, UA: 1.01
UROBILINOGEN UA: NEGATIVE
pH, UA: 6.5

## 2016-05-09 MED ORDER — TETANUS-DIPHTH-ACELL PERTUSSIS 5-2.5-18.5 LF-MCG/0.5 IM SUSP
0.5000 mL | Freq: Once | INTRAMUSCULAR | Status: AC
  Administered 2016-05-09: 0.5 mL via INTRAMUSCULAR

## 2016-05-09 NOTE — Progress Notes (Signed)
ROB: Patient c/o worsening back pain and pelvic pressure, feels miserable. Discussed IOL if desired.  Patient ok with plan, will schedule for IOL tomorrow at 8 a.m. Tdap given today. Will perform TOC for Gonorrhea and STD screen on L&D.

## 2016-05-10 ENCOUNTER — Inpatient Hospital Stay: Payer: Medicaid Other | Admitting: Anesthesiology

## 2016-05-10 ENCOUNTER — Inpatient Hospital Stay
Admission: RE | Admit: 2016-05-10 | Discharge: 2016-05-12 | DRG: 775 | Disposition: A | Discharge status: Home / Self Care | Payer: Medicaid Other | Attending: Obstetrics and Gynecology | Admitting: Obstetrics and Gynecology

## 2016-05-10 DIAGNOSIS — O480 Post-term pregnancy: Secondary | ICD-10-CM | POA: Diagnosis present

## 2016-05-10 DIAGNOSIS — O99214 Obesity complicating childbirth: Secondary | ICD-10-CM | POA: Diagnosis present

## 2016-05-10 DIAGNOSIS — Z3A40 40 weeks gestation of pregnancy: Secondary | ICD-10-CM

## 2016-05-10 DIAGNOSIS — Z3493 Encounter for supervision of normal pregnancy, unspecified, third trimester: Secondary | ICD-10-CM | POA: Diagnosis not present

## 2016-05-10 DIAGNOSIS — E669 Obesity, unspecified: Secondary | ICD-10-CM | POA: Diagnosis present

## 2016-05-10 DIAGNOSIS — Z87891 Personal history of nicotine dependence: Secondary | ICD-10-CM

## 2016-05-10 DIAGNOSIS — O0933 Supervision of pregnancy with insufficient antenatal care, third trimester: Secondary | ICD-10-CM

## 2016-05-10 DIAGNOSIS — Z6835 Body mass index (BMI) 35.0-35.9, adult: Secondary | ICD-10-CM

## 2016-05-10 LAB — RAPID HIV SCREEN (HIV 1/2 AB+AG)
HIV 1/2 Antibodies: NONREACTIVE
HIV-1 P24 ANTIGEN - HIV24: NONREACTIVE

## 2016-05-10 LAB — URINE DRUG SCREEN, QUALITATIVE (ARMC ONLY)
Amphetamines, Ur Screen: NOT DETECTED
BARBITURATES, UR SCREEN: NOT DETECTED
BENZODIAZEPINE, UR SCRN: NOT DETECTED
Cannabinoid 50 Ng, Ur ~~LOC~~: NOT DETECTED
Cocaine Metabolite,Ur ~~LOC~~: NOT DETECTED
MDMA (Ecstasy)Ur Screen: NOT DETECTED
METHADONE SCREEN, URINE: NOT DETECTED
OPIATE, UR SCREEN: NOT DETECTED
PHENCYCLIDINE (PCP) UR S: NOT DETECTED
Tricyclic, Ur Screen: NOT DETECTED

## 2016-05-10 LAB — CHLAMYDIA/NGC RT PCR (ARMC ONLY)
CHLAMYDIA TR: NOT DETECTED
N GONORRHOEAE: NOT DETECTED

## 2016-05-10 LAB — CBC
HCT: 31.7 % — ABNORMAL LOW (ref 35.0–47.0)
HEMOGLOBIN: 10.5 g/dL — AB (ref 12.0–16.0)
MCH: 26.8 pg (ref 26.0–34.0)
MCHC: 33.2 g/dL (ref 32.0–36.0)
MCV: 80.5 fL (ref 80.0–100.0)
Platelets: 234 10*3/uL (ref 150–440)
RBC: 3.94 MIL/uL (ref 3.80–5.20)
RDW: 14.2 % (ref 11.5–14.5)
WBC: 8.8 10*3/uL (ref 3.6–11.0)

## 2016-05-10 MED ORDER — OXYCODONE-ACETAMINOPHEN 5-325 MG PO TABS: 2.0000 | ORAL_TABLET | ORAL | Status: DC | PRN

## 2016-05-10 MED ORDER — PHENYLEPHRINE 40 MCG/ML (10ML) SYRINGE FOR IV PUSH (FOR BLOOD PRESSURE SUPPORT): 80.0000 ug | PREFILLED_SYRINGE | INTRAVENOUS | Status: DC | PRN

## 2016-05-10 MED ORDER — OXYTOCIN 40 UNITS IN LACTATED RINGERS INFUSION - SIMPLE MED
2.5000 [IU]/h | INTRAVENOUS | Status: DC
  Administered 2016-05-11: 2.5 [IU]/h via INTRAVENOUS

## 2016-05-10 MED ORDER — LIDOCAINE-EPINEPHRINE (PF) 1.5 %-1:200000 IJ SOLN
INTRAMUSCULAR | Status: DC | PRN
  Administered 2016-05-10: 3 mL via EPIDURAL
  Administered 2016-05-10: 3 mL via PERINEURAL

## 2016-05-10 MED ORDER — EPHEDRINE 5 MG/ML INJ: 10.0000 mg | INTRAVENOUS | Status: DC | PRN

## 2016-05-10 MED ORDER — FENTANYL 2.5 MCG/ML W/ROPIVACAINE 0.2% IN NS 100 ML EPIDURAL INFUSION (ARMC-ANES): 10.0000 mL/h | EPIDURAL | Status: DC

## 2016-05-10 MED ORDER — LACTATED RINGERS IV SOLN
INTRAVENOUS | Status: DC
  Administered 2016-05-10 (×2): via INTRAVENOUS

## 2016-05-10 MED ORDER — BUTORPHANOL TARTRATE 1 MG/ML IJ SOLN: 1.0000 mg | INTRAMUSCULAR | Status: DC | PRN

## 2016-05-10 MED ORDER — LACTATED RINGERS IV SOLN
500.0000 mL | INTRAVENOUS | Status: DC | PRN
  Administered 2016-05-10: 1000 mL via INTRAVENOUS

## 2016-05-10 MED ORDER — OXYTOCIN BOLUS FROM INFUSION
500.0000 mL | INTRAVENOUS | Status: DC
  Administered 2016-05-11: 500 mL via INTRAVENOUS

## 2016-05-10 MED ORDER — ACETAMINOPHEN 325 MG PO TABS: 650.0000 mg | ORAL_TABLET | ORAL | Status: DC | PRN

## 2016-05-10 MED ORDER — SOD CITRATE-CITRIC ACID 500-334 MG/5ML PO SOLN: 30.0000 mL | ORAL | Status: DC | PRN

## 2016-05-10 MED ORDER — BUPIVACAINE HCL (PF) 0.25 % IJ SOLN
INTRAMUSCULAR | Status: DC | PRN
  Administered 2016-05-10: 5 mL via EPIDURAL

## 2016-05-10 MED ORDER — AMMONIA AROMATIC IN INHA
RESPIRATORY_TRACT | Status: AC
Start: 2016-05-10 — End: 2016-05-10
  Filled 2016-05-10: qty 10

## 2016-05-10 MED ORDER — DIPHENHYDRAMINE HCL 50 MG/ML IJ SOLN: 12.5000 mg | INTRAMUSCULAR | Status: DC | PRN

## 2016-05-10 MED ORDER — TERBUTALINE SULFATE 1 MG/ML IJ SOLN: 0.2500 mg | Freq: Once | INTRAMUSCULAR | Status: DC | PRN

## 2016-05-10 MED ORDER — OXYTOCIN 40 UNITS IN LACTATED RINGERS INFUSION - SIMPLE MED
1.0000 m[IU]/min | INTRAVENOUS | Status: DC
  Administered 2016-05-10: 2 m[IU]/min via INTRAVENOUS
  Filled 2016-05-10: qty 1000

## 2016-05-10 MED ORDER — LACTATED RINGERS IV SOLN: 500.0000 mL | Freq: Once | INTRAVENOUS | Status: DC

## 2016-05-10 MED ORDER — OXYCODONE-ACETAMINOPHEN 5-325 MG PO TABS: 1.0000 | ORAL_TABLET | ORAL | Status: DC | PRN

## 2016-05-10 MED ORDER — FENTANYL 2.5 MCG/ML W/ROPIVACAINE 0.2% IN NS 100 ML EPIDURAL INFUSION (ARMC-ANES)
EPIDURAL | Status: DC | PRN
  Administered 2016-05-10: 10 mL/h via EPIDURAL

## 2016-05-10 MED ORDER — MISOPROSTOL 200 MCG PO TABS
ORAL_TABLET | ORAL | Status: AC
  Filled 2016-05-10: qty 4

## 2016-05-10 MED ORDER — LIDOCAINE HCL (PF) 1 % IJ SOLN
30.0000 mL | INTRAMUSCULAR | Status: AC | PRN
  Administered 2016-05-10: 2 mL via SUBCUTANEOUS

## 2016-05-10 MED ORDER — FENTANYL 2.5 MCG/ML W/ROPIVACAINE 0.2% IN NS 100 ML EPIDURAL INFUSION (ARMC-ANES)
EPIDURAL | Status: AC
  Administered 2016-05-10: 250 ug
  Filled 2016-05-10: qty 100

## 2016-05-10 MED ORDER — LIDOCAINE HCL (PF) 1 % IJ SOLN
INTRAMUSCULAR | Status: AC
  Filled 2016-05-10: qty 30

## 2016-05-10 MED ORDER — ONDANSETRON HCL 4 MG/2ML IJ SOLN: 4.0000 mg | Freq: Four times a day (QID) | INTRAMUSCULAR | Status: DC | PRN

## 2016-05-10 MED ORDER — OXYTOCIN 10 UNIT/ML IJ SOLN
INTRAMUSCULAR | Status: AC
  Filled 2016-05-10: qty 2

## 2016-05-10 NOTE — Progress Notes (Signed)
Intrapartum Progress Note  S: Patient denies major complaints.  Is s/p epidural placement.   O: Blood pressure 132/75, pulse 88, temperature 97.9 F (36.6 C), temperature source Oral, resp. rate 20, height 5\' 1"  (1.549 m), weight 234 lb (106.142 kg), last menstrual period 07/29/2015. Gen App: NAD, comfortable Abdomen: soft, gravid FHT: baseline 145 bpm.  Accels present.  Decels absent. moderate in degree variability.   Tocometer: contractions irregular Cervix: 3/60-70/-2 Extremities: Nontender, no edema.  Pitocin: 10 mIU  Labs:  No new labs   Assessment:  1: SIUP at 2017w6d 2. Post-dates pregnancy  Plan:  1. Continue IOL with Pitocin 2. AROM'd with clear fluid.    Hildred LaserAnika Vanya Carberry, MD 05/10/2016 9:47 PM

## 2016-05-10 NOTE — Progress Notes (Signed)
Intrapartum Progress Note  S: Patient denies major complaints  O: Blood pressure 132/75, pulse 88, temperature 97.9 F (36.6 C), temperature source Oral, resp. rate 18, height 5\' 1"  (1.549 m), weight 234 lb (106.142 kg), last menstrual period 07/29/2015. Gen App: NAD, comfortable Abdomen: soft, gravid FHT: baseline 140 bpm.  Accels present.  Decels absent. moderate in degree variability.   Tocometer: contractions irregular Cervix: 2/50/-3 Extremities: Nontender, no edema.  Pitocin: 16 mIU  Labs:  No new labs   Assessment:  1: SIUP at 8883w6d 2. Post-dates pregnancy  Plan:  1. Continue IOL with Pitocin 2. AROM'd with clear fluid.    Hildred LaserAnika Orvel Cutsforth, MD 05/10/2016 6:06 PM

## 2016-05-10 NOTE — H&P (Signed)
Obstetric History and Physical  Brianna SidleShaniquia Kathlen Juarez Brianna Juarez is a 20 y.o. [id number] with IUP at 171w6d presenting for elective IOL for postdates. Patient states she has been having infrequent contractions, none vaginal bleeding, intact membranes, with active fetal movement.    Prenatal Course Source of Care: [facility] with onset of care at 24 weeks (late entry to care, insufficient prenatal care in 3rd trimester). Pregnancy complications or risks: Patient Active Problem List   Diagnosis Date Noted  . Post-dates pregnancy 05/10/2016  . Gonorrhea affecting pregnancy in third trimester 05/09/2016  . Trichomonal vaginitis during pregnancy in third trimester 05/09/2016  . Insufficient prenatal care 01/21/2016  . Obesity, Class II, BMI 35-39.9 01/21/2016  . History of prior pregnancy with small for gestational age newborn 01/21/2016   She plans to breastfeed She desires oral contraceptives (estrogen/progesterone) for postpartum contraception.   Prenatal labs and studies: ABO, Rh: O/Positive/-- (03/09 0000) Antibody:  Negative Rubella: <20.0 (03/07 1518) RPR: Non Reactive (03/07 1518)  HBsAg: Negative (03/07 1518)  HIV: Non Reactive (03/07 1518)  GBS: Negative 1 hr Glucola  normal Genetic screening not performed due to advanced gestational age Anatomy US normal    Past Medical History  Diagnosis Date  . Obesity (BMI 30-39.9)   . H/O gonorrhea     Past Surgical History  Procedure Laterality Date  . Therapeutic abortion  2015    OB History  Gravida Para Term Preterm AB SAB TAB Ectopic Multiple Living  5 2 2  2 1    2     # Outcome Date GA Lbr Len/2nd Weight Sex Delivery Anes PTL Lv  5 Current           4 AB 2015 453w0d    TAB     3 Term 09/2013 4516w3d  6 lb 3 oz (2.807 kg) Genella MechM Vag-Spont EPI  Y  2 Term 2013 657w1d  5 lb 15 oz (2.693 kg) M Vag-Spont EPI  Y  1 SAB 2012 1552w0d       ND      Social History   Social History  . Marital Status: Single    Spouse Name: N/A  .  Number of Children: N/A  . Years of Education: N/A   Social History Main Topics  . Smoking status: Former Smoker -- 0.25 packs/day    Quit date: 09/13/2015  . Smokeless tobacco: None  . Alcohol Use: No     Comment: occasionaly  . Drug Use: No  . Sexual Activity: Not Currently    Birth Control/ Protection: None   Other Topics Concern  . None   Social History Narrative    History reviewed. No pertinent family history.  Prescriptions prior to admission  Medication Sig Dispense Refill Last Dose  . fluconazole (DIFLUCAN) 150 MG tablet Take 1 tablet (150 mg total) by mouth once. After completion of antibiotics. 1 tablet 3 Past Month at Unknown time  . metroNIDAZOLE (FLAGYL) 500 MG tablet Take 1 tablet (500 mg total) by mouth 2 (two) times daily. 14 tablet 0 Past Month at Unknown time  . Prenatal Vit-Fe Fumarate-FA (PRENATAL MULTIVITAMIN) TABS tablet Take 1 tablet by mouth daily at 12 noon. 30 tablet 6 05/09/2016 at Unknown time    No Known Allergies  Review of Systems: Negative except for what is mentioned in HPI.  Physical Exam: BP 109/53 mmHg  Pulse 81  Temp(Src) 97.8 F (36.6 C) (Oral)  Resp 18  Ht 5\' 1"  (1.549 m)  Wt 234 lb (106.142  kg)  BMI 44.24 kg/m2  LMP 07/29/2015 CONSTITUTIONAL: Well-developed, well-nourished female in no acute distress.  HENT:  Normocephalic, atraumatic, External right and left ear normal. Oropharynx is clear and moist EYES: Conjunctivae and EOM are normal. Pupils are equal, round, and reactive to light. No scleral icterus.  NECK: Normal range of motion, supple, no masses SKIN: Skin is warm and dry. No rash noted. Not diaphoretic. No erythema. No pallor. NEUROLOGIC: Alert and oriented to person, place, and time. Normal reflexes, muscle tone coordination. No cranial nerve deficit noted. PSYCHIATRIC: Normal mood and affect. Normal behavior. Normal judgment and thought content. CARDIOVASCULAR: Normal heart rate noted, regular rhythm RESPIRATORY:  Effort and breath sounds normal, no problems with respiration noted ABDOMEN: Soft, nontender, nondistended, gravid. MUSCULOSKELETAL: Normal range of motion. No edema and no tenderness. 2+ distal pulses.  Cervical Exam: Dilatation 2 cm   Effacement 50-60%   Station -3   Presentation: cephalic FHT:  Baseline rate 140 bpm   Variability moderate  Accelerations present   Decelerations none Contractions: Every irregular, q 3-6 mins   Pertinent Labs/Studies:   Results for orders placed or performed during the hospital encounter of 05/10/16 (from the past 24 hour(s))  CBC     Status: Abnormal   Collection Time: 05/10/16  9:54 AM  Result Value Ref Range   WBC 8.8 3.6 - 11.0 K/uL   RBC 3.94 3.80 - 5.20 MIL/uL   Hemoglobin 10.5 (L) 12.0 - 16.0 g/dL   HCT 16.131.7 (L) 09.635.0 - 04.547.0 %   MCV 80.5 80.0 - 100.0 fL   MCH 26.8 26.0 - 34.0 pg   MCHC 33.2 32.0 - 36.0 g/dL   RDW 40.914.2 81.111.5 - 91.414.5 %   Platelets 234 150 - 440 K/uL  Rapid HIV screen (HIV 1/2 Ab+Ag)     Status: None   Collection Time: 05/10/16  9:54 AM  Result Value Ref Range   HIV-1 P24 Antigen - HIV24 NON REACTIVE NON REACTIVE   HIV 1/2 Antibodies NON REACTIVE NON REACTIVE   Interpretation (HIV Ag Ab)      A non reactive test result means that HIV 1 or HIV 2 antibodies and HIV 1 p24 antigen were not detected in the specimen.  Chlamydia/NGC rt PCR ([facility] only)     Status: None   Collection Time: 05/10/16  9:55 AM  Result Value Ref Range   Specimen source GC/Chlam URINE, RANDOM    Chlamydia Tr NOT DETECTED NOT DETECTED   N gonorrhoeae NOT DETECTED NOT DETECTED  Urine Drug Screen, Qualitative ([facility] only)     Status: None   Collection Time: 05/10/16  9:55 AM  Result Value Ref Range   Tricyclic, Ur Screen NONE DETECTED NONE DETECTED   Amphetamines, Ur Screen NONE DETECTED NONE DETECTED   MDMA (Ecstasy)Ur Screen NONE DETECTED NONE DETECTED   Cocaine Metabolite,Ur [place] NONE DETECTED NONE DETECTED   Opiate, Ur Screen NONE DETECTED NONE DETECTED    Phencyclidine (PCP) Ur S NONE DETECTED NONE DETECTED   Cannabinoid 50 Ng, Ur [place] NONE DETECTED NONE DETECTED   Barbiturates, Ur Screen NONE DETECTED NONE DETECTED   Benzodiazepine, Ur Scrn NONE DETECTED NONE DETECTED   Methadone Scn, Ur NONE DETECTED NONE DETECTED    Assessment : Brianna Juarez is a 20 y.o. [id number] at 8066w6d being admitted for elective IOL for postdates.  Plan: Labor: Induction with Pitocin, per protocol.  TOC for STI's in pregnancy. FWB: Reassuring fetal heart tracing.  GBS negative Delivery plan: Hopeful for vaginal delivery  Rubie Maid, MD [facility]

## 2016-05-10 NOTE — Anesthesia Procedure Notes (Signed)
Epidural Patient location during procedure: OB Start time: 05/10/2016 8:28 PM End time: 05/10/2016 8:49 PM  Staffing Anesthesiologist: Elijio MilesVAN STAVEREN, Anan Dapolito F Performed by: anesthesiologist   Preanesthetic Checklist Completed: patient identified, site marked, pre-op evaluation, timeout performed, IV checked, risks and benefits discussed, monitors and equipment checked and at surgeon's request  Epidural Patient position: sitting Prep: Betadine Patient monitoring: heart rate and blood pressure Approach: midline Location: L3-L4 Injection technique: LOR air and LOR saline  Needle:  Needle type: Tuohy  Needle gauge: 18 G Needle length: 9 cm Needle insertion depth: 7 cm Catheter type: closed end flexible Catheter size: 20 Guage Catheter at skin depth: 11 cm Test dose: 1.5% lidocaine with Epi 1:200 K  Assessment Sensory level: T8  Additional Notes Reason for block:at surgeon's request

## 2016-05-11 DIAGNOSIS — Z3493 Encounter for supervision of normal pregnancy, unspecified, third trimester: Secondary | ICD-10-CM

## 2016-05-11 LAB — TYPE AND SCREEN
ABO/RH(D): O POS
ANTIBODY SCREEN: NEGATIVE

## 2016-05-11 LAB — CBC
HCT: 33.7 % — ABNORMAL LOW (ref 35.0–47.0)
HEMOGLOBIN: 11.2 g/dL — AB (ref 12.0–16.0)
MCH: 26.7 pg (ref 26.0–34.0)
MCHC: 33.3 g/dL (ref 32.0–36.0)
MCV: 80.3 fL (ref 80.0–100.0)
Platelets: 244 10*3/uL (ref 150–440)
RBC: 4.2 MIL/uL (ref 3.80–5.20)
RDW: 14.2 % (ref 11.5–14.5)
WBC: 13 10*3/uL — AB (ref 3.6–11.0)

## 2016-05-11 LAB — RPR: RPR Ser Ql: NONREACTIVE

## 2016-05-11 MED ORDER — TETANUS-DIPHTH-ACELL PERTUSSIS 5-2.5-18.5 LF-MCG/0.5 IM SUSP: 0.5000 mL | Freq: Once | INTRAMUSCULAR | Status: DC

## 2016-05-11 MED ORDER — SENNOSIDES-DOCUSATE SODIUM 8.6-50 MG PO TABS
2.0000 | ORAL_TABLET | ORAL | Status: DC
  Administered 2016-05-11: 2 via ORAL
  Filled 2016-05-11: qty 2

## 2016-05-11 MED ORDER — ACETAMINOPHEN 325 MG PO TABS
650.0000 mg | ORAL_TABLET | ORAL | Status: DC | PRN
  Administered 2016-05-11 (×2): 650 mg via ORAL
  Filled 2016-05-11 (×2): qty 2

## 2016-05-11 MED ORDER — FERROUS SULFATE 325 (65 FE) MG PO TABS
325.0000 mg | ORAL_TABLET | Freq: Two times a day (BID) | ORAL | Status: DC
  Administered 2016-05-11 – 2016-05-12 (×3): 325 mg via ORAL
  Filled 2016-05-11 (×3): qty 1

## 2016-05-11 MED ORDER — SIMETHICONE 80 MG PO CHEW: 80.0000 mg | CHEWABLE_TABLET | ORAL | Status: DC | PRN

## 2016-05-11 MED ORDER — COCONUT OIL OIL: 1.0000 "application " | TOPICAL_OIL | Status: DC | PRN

## 2016-05-11 MED ORDER — WITCH HAZEL-GLYCERIN EX PADS: 1.0000 "application " | MEDICATED_PAD | CUTANEOUS | Status: DC | PRN

## 2016-05-11 MED ORDER — ZOLPIDEM TARTRATE 5 MG PO TABS: 5.0000 mg | ORAL_TABLET | Freq: Every evening | ORAL | Status: DC | PRN

## 2016-05-11 MED ORDER — DIPHENHYDRAMINE HCL 25 MG PO CAPS: 25.0000 mg | ORAL_CAPSULE | Freq: Four times a day (QID) | ORAL | Status: DC | PRN

## 2016-05-11 MED ORDER — ACETAMINOPHEN-CODEINE #3 300-30 MG PO TABS
1.0000 | ORAL_TABLET | ORAL | Status: DC | PRN
Start: 2016-05-11 — End: 2016-05-12
  Filled 2016-05-11: qty 1

## 2016-05-11 MED ORDER — ONDANSETRON HCL 4 MG/2ML IJ SOLN: 4.0000 mg | INTRAMUSCULAR | Status: DC | PRN

## 2016-05-11 MED ORDER — IBUPROFEN 600 MG PO TABS
600.0000 mg | ORAL_TABLET | Freq: Four times a day (QID) | ORAL | Status: DC
  Administered 2016-05-11 – 2016-05-12 (×6): 600 mg via ORAL
  Filled 2016-05-11 (×7): qty 1

## 2016-05-11 MED ORDER — DIBUCAINE 1 % RE OINT: 1.0000 "application " | TOPICAL_OINTMENT | RECTAL | Status: DC | PRN

## 2016-05-11 MED ORDER — ONDANSETRON HCL 4 MG PO TABS: 4.0000 mg | ORAL_TABLET | ORAL | Status: DC | PRN

## 2016-05-11 MED ORDER — BENZOCAINE-MENTHOL 20-0.5 % EX AERO
1.0000 "application " | INHALATION_SPRAY | CUTANEOUS | Status: DC | PRN
  Administered 2016-05-11: 1 via TOPICAL
  Filled 2016-05-11: qty 56

## 2016-05-11 MED ORDER — PRENATAL MULTIVITAMIN CH
1.0000 | ORAL_TABLET | Freq: Every day | ORAL | Status: DC
  Administered 2016-05-11 – 2016-05-12 (×2): 1 via ORAL
  Filled 2016-05-11 (×2): qty 1

## 2016-05-11 NOTE — Clinical Social Work Note (Signed)
CSW consulted for lack of prenatal care. At this point and time, there is no intervention that CSW can make regarding this concern. Patient's nurse has reported this is patient's 3rd child and that she has family support from her mother and that patient has been bonding well and caring for the baby appropriately. There are no concerns regarding resources. York SpanielMonica Shalaya Swailes MSW,LCSW [phone]

## 2016-05-11 NOTE — Plan of Care (Signed)
Problem: Education: Goal: Knowledge of Childbirth will improve Outcome: Completed/Met Date Met:  05/11/16 SVD viable term infant, female @ 18, no laceration or repair needed. EBL 100. FF'@U'$ , small lochia rubra, no clots. Motrin given prior to transfer at [place]

## 2016-05-12 LAB — RUBELLA SCREEN: Rubella: 5.07 index (ref >0.99)

## 2016-05-12 MED ORDER — DOCUSATE SODIUM 100 MG PO CAPS: 100.0000 mg | ORAL_CAPSULE | Freq: Two times a day (BID) | ORAL | Status: DC | PRN

## 2016-05-12 MED ORDER — IBUPROFEN 800 MG PO TABS: 800.0000 mg | ORAL_TABLET | Freq: Three times a day (TID) | ORAL | Status: AC | PRN

## 2016-05-12 NOTE — Progress Notes (Signed)
Post Partum Day # 1, s/p SVD  Subjective: up ad lib, voiding and tolerating PO.  Complains of back pain not relieved by Tylenol or Motrin  Objective: Temp:  [97.8 F (36.6 C)-98.7 F (37.1 C)] 98 F (36.7 C) (06/30 0726) Pulse Rate:  [73-78] 76 (06/30 0726) Resp:  [18-20] 20 (06/30 0726) BP: (97-123)/(49-71) 100/62 mmHg (06/30 0726) SpO2:  [99 %-100 %] 100 % (06/30 0726)  Physical Exam:  General: alert and no distress  Lungs: clear to auscultation bilaterally Breasts: normal appearance, no masses or tenderness Heart: regular rate and rhythm, S1, S2 normal, no murmur, click, rub or gallop Pelvis: Lochia: appropriate, Uterine Fundus: firm Extremities: DVT Evaluation: Negative Homan's sign. No cords or calf tenderness. No significant calf/ankle edema.   Recent Labs  05/10/16 0954 05/11/16 1402  HGB 10.5* 11.2*  HCT 31.7* 33.7*    Assessment/Plan: S/p Social Work consult for insufficient prenatal care Tylenol #3 for back pain prn Contraception deciding between Nexplanon and hormonal IUD Bottle feeding but encouraged to try breastfeeding/pumping Discharge home   LOS: 2 days   Hildred LaserAnika Paylin Hailu [facility]

## 2016-05-12 NOTE — Discharge Summary (Signed)
Obstetric Discharge Summary Reason for Admission: induction of labor for post-dates Prenatal Procedures: ultrasound Intrapartum Procedures: spontaneous vaginal delivery Postpartum Procedures: none Complications-Operative and Postpartum: none HEMOGLOBIN  Date Value Ref Range Status  05/11/2016 11.2* 12.0 - 16.0 g/dL Final   HGB  Date Value Ref Range Status  02/13/2015 12.3 12.0-16.0 g/dL Final   HCT  Date Value Ref Range Status  05/11/2016 33.7* 35.0 - 47.0 % Final  02/13/2015 38.1 35.0-47.0 % Final   HEMATOCRIT  Date Value Ref Range Status  01/18/2016 35.1 34.0 - 46.6 % Final    Physical Exam:  General: alert and no distress Lochia: appropriate Uterine Fundus: firm Incision: None DVT Evaluation: Negative Homan's sign. No cords or calf tenderness. No significant calf/ankle edema.  Discharge Diagnoses: Term Pregnancy-delivered, insufficient [facility]  Discharge Information: Date: 05/12/2016 Activity: pelvic rest x 6 weeks Diet: routine Medications: PNV, Ibuprofen and Colace Condition: stable Instructions: refer to practice specific booklet Discharge to: home Follow-up Information    Follow up with Hildred LaserAnika Coreen Shippee, MD In 6 weeks.   Specialties:  Obstetrics and Gynecology, Radiology   Why:  Postpartum visit   Contact information:   [address] [place] [id number] [phone]       Newborn Data: Live born female  Birth Weight: 7 lb 0.2 oz (3180 g) APGAR: 8, 9  Home with mother.  Hildred Lasernika Caraline Deutschman 05/12/2016, 8:49 AM

## 2016-05-12 NOTE — Discharge Instructions (Signed)
General Postpartum Discharge Instructions ° °Do not drink alcohol or take tranquilizers.  °Do not take medicine that has not been prescribed by your doctor.  °Take showers instead of baths until your doctor gives you permission to take baths.  °No sexual intercourse or placement of anything in the vagina for 6 weeks or as instructed by your doctor. °Only take prescription or over-the-counter medicines  for pain, discomfort, or fever as directed by your doctor. Take medicines (antibiotics) that kill germs if they are prescribed for you. °  °Call the office or go to the Emergency Room if:  °You feel sick to your stomach (nauseous).  °You start to throw up (vomit).  °You have trouble eating or drinking.  °You have an oral temperature above 101.  °You have constipation that is not helped by adjusting diet or increasing fluid intake. Pain medicines are a common cause of constipation.  °You have foul smelling vaginal discharge or odor.  °You have bleeding requiring changing more than 1 pad per hour. °You have any other concerns. ° °SEEK IMMEDIATE MEDICAL CARE IF:  °You have persistent dizziness.  °You have difficulty breathing or shortness of breath.  °You have an oral temperature above 102.5, not controlled by medicine.  ° ° ° °Please call your doctor or return to the ER if you experience any chest pains, shortness of breath, fever greater than 101, any heavy bleeding or large clots, and foul smelling vaginal discharge, any worsening abdominal pain & cramping that is not controlled by pain medication, or any signs of post partum depression.  No tampons, enemas, douches, or sexual intercourse for 6 weeks.  Also avoid tub baths, hot tubs, or swimming for 6 weeks. ° °Postpartum Care After Vaginal Delivery °After you deliver your newborn (postpartum period), the usual stay in the hospital is 24-72 hours. If there were problems with your labor or delivery, or if you have other medical problems, you might be in the hospital  longer.  °While you are in the hospital, you will receive help and instructions on how to care for yourself and your newborn during the postpartum period.  °While you are in the hospital: °· Be sure to tell your nurses if you have pain or discomfort, as well as where you feel the pain and what makes the pain worse. °· If you had an incision made near your vagina (episiotomy) or if you had some tearing during delivery, the nurses may put ice packs on your episiotomy or tear. The ice packs may help to reduce the pain and swelling. °· If you are breastfeeding, you may feel uncomfortable contractions of your uterus for a couple of weeks. This is normal. The contractions help your uterus get back to normal size. °· It is normal to have some bleeding after delivery. °¨ For the first 1-3 days after delivery, the flow is red and the amount may be similar to a period. °¨ It is common for the flow to start and stop. °¨ In the first few days, you may pass some small clots. Let your nurses know if you begin to pass large clots or your flow increases. °¨ Do not  flush blood clots down the toilet before having the nurse look at them. °¨ During the next 3-10 days after delivery, your flow should become more watery and pink or brown-tinged in color. °¨ Ten to fourteen days after delivery, your flow should be a small amount of yellowish-white discharge. °¨ The amount of your flow will   decrease over the first few weeks after delivery. Your flow may stop in 6-8 weeks. Most women have had their flow stop by 12 weeks after delivery. °· You should change your sanitary pads frequently. °· Wash your hands thoroughly with soap and water for at least 20 seconds after changing pads, using the toilet, or before holding or feeding your newborn. °· You should feel like you need to empty your bladder within the first 6-8 hours after delivery. °· In case you become weak, lightheaded, or faint, call your nurse before you get out of bed for the  first time and before you take a shower for the first time. °· Within the first few days after delivery, your breasts may begin to feel tender and full. This is called engorgement. Breast tenderness usually goes away within 48-72 hours after engorgement occurs. You may also notice milk leaking from your breasts. If you are not breastfeeding, do not stimulate your breasts. Breast stimulation can make your breasts produce more milk. °· Spending as much time as possible with your newborn is very important. During this time, you and your newborn can feel close and get to know each other. Having your newborn stay in your room (rooming in) will help to strengthen the bond with your newborn.  It will give you time to get to know your newborn and become comfortable caring for your newborn. °· Your hormones change after delivery. Sometimes the hormone changes can temporarily cause you to feel sad or tearful. These feelings should not last more than a few days. If these feelings last longer than that, you should talk to your caregiver. °· If desired, talk to your caregiver about methods of family planning or contraception. °· Talk to your caregiver about immunizations. Your caregiver may want you to have the following immunizations before leaving the hospital: °¨ Tetanus, diphtheria, and pertussis (Tdap) or tetanus and diphtheria (Td) immunization. It is very important that you and your family (including grandparents) or others caring for your newborn are up-to-date with the Tdap or Td immunizations. The Tdap or Td immunization can help protect your newborn from getting ill. °¨ Rubella immunization. °¨ Varicella (chickenpox) immunization. °¨ Influenza immunization. You should receive this annual immunization if you did not receive the immunization during your pregnancy. °  °This information is not intended to replace advice given to you by your health care provider. Make sure you discuss any questions you have with your  health care provider. °  °Document Released: 08/27/2007 Document Revised: 07/24/2012 Document Reviewed: 06/26/2012 °Elsevier Interactive Patient Education ©2016 [employer] ° °

## 2016-05-12 NOTE — Anesthesia Postprocedure Evaluation (Signed)
Anesthesia Post Note  Patient: Brianna Juarez  Procedure(s) Performed: * No procedures listed *  Patient location during evaluation: Mother Baby Anesthesia Type: Epidural Level of consciousness: awake and alert Pain management: pain level controlled Vital Signs Assessment: post-procedure vital signs reviewed and stable Respiratory status: spontaneous breathing, nonlabored ventilation and respiratory function stable Cardiovascular status: stable Postop Assessment: no headache, no backache and epidural receding Anesthetic complications: no    Last Vitals:  Filed Vitals:   05/12/16 0300 05/12/16 0726  BP:  100/62  Pulse:  76  Temp: 36.8 C 36.7 C  Resp:  20    Last Pain:  Filed Vitals:   05/12/16 0726  PainSc: 3                  Starling Mannsurtis,  Oziah Vitanza A

## 2016-05-12 NOTE — Progress Notes (Signed)
D/C order from MD.  Reviewed d/c instructions and prescriptions with patient and answered any questions.  Patient d/c home with infant via wheelchair by nursing/auxillary. 

## 2016-05-24 ENCOUNTER — Encounter: Payer: Self-pay | Admitting: Obstetrics and Gynecology

## 2016-06-01 ENCOUNTER — Telehealth: Payer: Self-pay | Admitting: Obstetrics and Gynecology

## 2016-06-01 NOTE — Telephone Encounter (Signed)
PT CALLED AND SHE STATED THAT SHE NEEDS THE [employer] PAPER FILLED OUT AND TURNED IN July 31, SHE SAID THEY FAXED THE RETURN TO WORK PAPER WITH ALL HER OTHER, DO YOU HAVE THIS, HER POST PARTUM IS 8/10, PT DID WANT A CALL BACK FROM YOU ABOUT THIS, SHE WANTS TO GO BACK SOONER TO WORK AND SHE ISNT SURE IF DR CHERRY CAN SEE HER BEFORE HER 6 WEEK POST PARTUM CHECK.

## 2016-06-01 NOTE — Telephone Encounter (Signed)
Tried calling pt back to let her know and the number was not in service, i verified number with pt when i sent the message to begin with. If she calls back i will let her know, thank you.

## 2016-06-01 NOTE — Telephone Encounter (Signed)
I have not seen any paperwork for this pt, if she brings the paper in and pays her fee I will be glad to fill it out, Thanks

## 2016-06-02 NOTE — Telephone Encounter (Signed)
Per Zella Ballobin, this pt's paperwork is up front with her, however she has not paid her $25.00 fee. Thanks

## 2016-06-19 DIAGNOSIS — Z0289 Encounter for other administrative examinations: Secondary | ICD-10-CM

## 2016-06-22 ENCOUNTER — Ambulatory Visit (INDEPENDENT_AMBULATORY_CARE_PROVIDER_SITE_OTHER): Payer: Medicaid Other | Admitting: Obstetrics and Gynecology

## 2016-06-22 ENCOUNTER — Encounter: Payer: Self-pay | Admitting: Obstetrics and Gynecology

## 2016-06-22 DIAGNOSIS — Z30017 Encounter for initial prescription of implantable subdermal contraceptive: Secondary | ICD-10-CM | POA: Diagnosis not present

## 2016-06-22 MED ORDER — ETONOGESTREL 68 MG ~~LOC~~ IMPL: 68.0000 mg | DRUG_IMPLANT | Freq: Once | SUBCUTANEOUS | Status: AC

## 2016-06-22 NOTE — Progress Notes (Signed)
   OBSTETRICS POSTPARTUM CLINIC PROGRESS NOTE  Subjective:     Brianna Juarez is a 20 y.o. [phone] female who presents for a postpartum visit. She is 6 weeks postpartum following a spontaneous vaginal delivery. I have fully reviewed the prenatal and intrapartum course. The delivery was at 40 gestational weeks.  Anesthesia: epidural. Postpartum course has been well. Baby's course has been well. Baby is feeding by bottle with formula. Bleeding: patient has resumed menses, with Patient's last menstrual period was 06/17/2016 (approximate).. Bowel function is normal. Bladder function is normal. Patient is sexually active. Contraception method desired is Nexplanon. Postpartum depression screening: negative.  The following portions of the patient's history were reviewed and updated as appropriate: allergies, current medications, past family history, past medical history, past social history, past surgical history and problem list.  Review of Systems Pertinent items noted in HPI and remainder of comprehensive ROS otherwise negative.   Objective:    BP 135/80   Pulse 83   Ht 5\' 1"  (1.549 m)   Wt 213 lb 8 oz (96.8 kg)   LMP 06/17/2016 (Approximate)   Breastfeeding? No   BMI 40.34 kg/m   General:  alert and no distress   Breasts:  inspection negative, no nipple discharge or bleeding, no masses or nodularity palpable  Lungs: clear to auscultation bilaterally  Heart:  regular rate and rhythm, S1, S2 normal, no murmur, click, rub or gallop  Abdomen: soft, non-tender; bowel sounds normal; no masses,  no organomegaly.    Vulva:  normal  Vagina: normal vagina, no discharge, exudate, lesion, or erythema.  Blood in vaginal vault.   Cervix:  no cervical motion tenderness and no lesions  Corpus: normal size, contour, position, consistency, mobility, non-tender  Adnexa:  normal adnexa and no mass, fullness, tenderness  Rectal Exam: Not performed.         Labs:  Lab Results  Component Value Date     HGB 11.2 (L) 05/11/2016    Assessment:   Routine  postpartum exam.   Contraception management  Plan:   1. Contraception: Nexplanon, inserted today (see procedure note below).  2. Follow up 3-6 months for annual exam or as needed.     Nexplanon Insertion Procedure Patient identified, informed consent performed, consent signed.   Patient does understand that irregular bleeding is a very common side effect of this medication. She was advised to have backup contraception for one week after placement. No pregnancy test performed as patient currently on menstrual cycle.  Appropriate time out taken.  Patient's left arm was prepped and draped in the usual sterile fashion. The ruler used to measure and mark insertion area.  Patient was prepped with alcohol swab and then injected with 3 ml of 1% lidocaine.  She was prepped with betadine, Nexplanon removed from packaging,  Device confirmed in needle, then inserted full length of needle and withdrawn per handbook instructions. Nexplanon was able to palpated in the patient's arm; patient palpated the insert herself. There was minimal blood loss.  Patient insertion site covered with guaze and a pressure bandage to reduce any bruising.  The patient tolerated the procedure well and was given post procedure instructions.     Hildred LaserAnika Kittie Krizan, MD [facility]

## 2016-07-18 ENCOUNTER — Ambulatory Visit: Payer: Medicaid Other

## 2016-09-27 ENCOUNTER — Encounter: Payer: Medicaid Other | Admitting: Obstetrics and Gynecology

## 2017-07-12 ENCOUNTER — Encounter: Payer: Self-pay | Admitting: Emergency Medicine

## 2017-07-12 ENCOUNTER — Emergency Department: Payer: Self-pay

## 2017-07-12 ENCOUNTER — Emergency Department
Admission: EM | Admit: 2017-07-12 | Discharge: 2017-07-12 | Disposition: A | Discharge status: Home / Self Care | Payer: Self-pay | Attending: Emergency Medicine | Admitting: Emergency Medicine

## 2017-07-12 DIAGNOSIS — W450XXA Nail entering through skin, initial encounter: Secondary | ICD-10-CM | POA: Insufficient documentation

## 2017-07-12 DIAGNOSIS — Y939 Activity, unspecified: Secondary | ICD-10-CM | POA: Insufficient documentation

## 2017-07-12 DIAGNOSIS — S91332A Puncture wound without foreign body, left foot, initial encounter: Secondary | ICD-10-CM | POA: Insufficient documentation

## 2017-07-12 DIAGNOSIS — Z79899 Other long term (current) drug therapy: Secondary | ICD-10-CM | POA: Insufficient documentation

## 2017-07-12 DIAGNOSIS — Y929 Unspecified place or not applicable: Secondary | ICD-10-CM | POA: Insufficient documentation

## 2017-07-12 DIAGNOSIS — Y999 Unspecified external cause status: Secondary | ICD-10-CM | POA: Insufficient documentation

## 2017-07-12 DIAGNOSIS — Z87891 Personal history of nicotine dependence: Secondary | ICD-10-CM | POA: Insufficient documentation

## 2017-07-12 MED ORDER — TRAMADOL HCL 50 MG PO TABS: 50.0000 mg | ORAL_TABLET | Freq: Four times a day (QID) | ORAL | 0 refills | Status: AC | PRN

## 2017-07-12 MED ORDER — AMOXICILLIN-POT CLAVULANATE 875-125 MG PO TABS: 1.0000 | ORAL_TABLET | Freq: Two times a day (BID) | ORAL | 0 refills | Status: DC

## 2017-07-12 NOTE — ED Provider Notes (Signed)
[facility] Emergency Department Provider Note  ____________________________________________  Time seen: Approximately 2:39 PM  I have reviewed the triage vital signs and the nursing notes.   HISTORY  Chief Complaint Puncture Wound   HPI Brianna Juarez is a 21 y.o. female who presents to the emergency department for treatment of left foot pain after stepping on a nail yesterday. She states that she pulled the nail out and estimates that it went about 2 inches into her foot. She states that the area is very sore and has bled some this morning.No alleviating measures have been attempted for this complaint. Last tetanus within the last 4 years.   Past Medical History:  Diagnosis Date  . H/O gonorrhea   . Obesity (BMI 30-39.9)     Patient Active Problem List   Diagnosis Date Noted  . Post-dates pregnancy 05/10/2016  . Gonorrhea affecting pregnancy in third trimester 05/09/2016  . Trichomonal vaginitis during pregnancy in third trimester 05/09/2016  . Insufficient prenatal care 01/21/2016  . Obesity, Class II, BMI 35-39.9 01/21/2016  . History of prior pregnancy with small for gestational age newborn 01/21/2016    Past Surgical History:  Procedure Laterality Date  . THERAPEUTIC ABORTION  2015    Prior to Admission medications   Medication Sig Start Date End Date Taking? Authorizing Provider  amoxicillin-clavulanate (AUGMENTIN) 875-125 MG tablet Take 1 tablet by mouth 2 (two) times daily. 07/12/17   Tearah Saulsbury B, FNP  ibuprofen (ADVIL,MOTRIN) 800 MG tablet Take 1 tablet (800 mg total) by mouth every 8 (eight) hours as needed. 05/12/16   Hildred Laserherry, Anika, MD  traMADol (ULTRAM) 50 MG tablet Take 1 tablet (50 mg total) by mouth every 6 (six) hours as needed. 07/12/17   Chinita Pesterriplett, Hero Kulish B, FNP    Allergies Patient has no known allergies.  No family history on file.  Social History Social History  Substance Use Topics  . Smoking status: Former Smoker     Packs/day: 0.25    Quit date: 09/13/2015  . Smokeless tobacco: Never Used  . Alcohol use Yes     Comment: occasionaly    Review of Systems  Constitutional: Negative for fever. Respiratory: Negative for cough or shortness of breath.  Musculoskeletal: Positive for left foot pain. Skin: Positive for plantar puncture wound. Neurological: Negative for numbness or paresthesias. ____________________________________________   PHYSICAL EXAM:  VITAL SIGNS: ED Triage Vitals  Enc Vitals Group     BP 07/12/17 1156 (!) 119/102     Pulse Rate 07/12/17 1156 (!) 103     Resp 07/12/17 1156 18     Temp 07/12/17 1156 98.7 F (37.1 C)     Temp Source 07/12/17 1156 Oral     SpO2 07/12/17 1156 98 %     Weight 07/12/17 1153 230 lb (104.3 kg)     Height 07/12/17 1153 5\' 1"  (1.549 m)     Head Circumference --      Peak Flow --      Pain Score 07/12/17 1152 9     Pain Loc --      Pain Edu? --      Excl. in GC? --      Constitutional: Well appearing. Eyes: Conjunctivae are clear without discharge or drainage. Nose: No rhinorrhea noted. Mouth/Throat: Airway is patent.  Neck: No stridor. Unrestricted range of motion observed.  Cardiovascular: Capillary refill is <3 seconds.  Respiratory: Respirations are even and unlabored.. Musculoskeletal: Plantar aspect of left foot tender in the area  of puncture wound. Neurologic: Awake, alert, and oriented x 4.  Skin:  Puncture wound to the plantar aspect of the left foot.  ____________________________________________   LABS (all labs ordered are listed, but only abnormal results are displayed)  Labs Reviewed - No data to display ____________________________________________  EKG  Not indicated ____________________________________________  RADIOLOGY  Left foot image is without acute bony abnormality or retained foreign body. ____________________________________________   PROCEDURES  Procedure(s) performed:  None ____________________________________________   INITIAL IMPRESSION / ASSESSMENT AND PLAN / ED COURSE  Brianna Juarez is a 21 y.o. female who presents to the emergency department for evaluation of plantar puncture wound. X-ray negative for bony abnormality or retained foreign body.   Pertinent labs & imaging results that were available during my care of the patient were reviewed by me and considered in my medical decision making (see chart for details). ____________________________________________   FINAL CLINICAL IMPRESSION(S) / ED DIAGNOSES  Final diagnoses:  Puncture wound of plantar aspect of left foot, initial encounter    Discharge Medication List as of 07/12/2017  2:30 PM    START taking these medications   Details  amoxicillin-clavulanate (AUGMENTIN) 875-125 MG tablet Take 1 tablet by mouth 2 (two) times daily., Starting Thu 07/12/2017, Print    traMADol (ULTRAM) 50 MG tablet Take 1 tablet (50 mg total) by mouth every 6 (six) hours as needed., Starting Thu 07/12/2017, Print        If controlled substance prescribed during this visit, 12 month history viewed on the [place] prior to issuing an initial prescription for Schedule II or III opiod.   Note:  This document was prepared using Dragon voice recognition software and may include unintentional dictation errors.    Chinita Pester, FNP 07/12/17 1504    Myrna Blazer, MD 07/12/17 [phone]

## 2017-07-12 NOTE — ED Triage Notes (Signed)
Patient presents to the ED after stepping on a nail with her left foot yesterday.  Patient ambulatory to triage, no obvious distress at this time.

## 2017-07-15 ENCOUNTER — Emergency Department
Admission: EM | Admit: 2017-07-15 | Discharge: 2017-07-15 | Disposition: A | Discharge status: Home / Self Care | Payer: Self-pay | Attending: Emergency Medicine | Admitting: Emergency Medicine

## 2017-07-15 ENCOUNTER — Encounter: Payer: Self-pay | Admitting: Emergency Medicine

## 2017-07-15 DIAGNOSIS — M2141 Flat foot [pes planus] (acquired), right foot: Secondary | ICD-10-CM | POA: Insufficient documentation

## 2017-07-15 DIAGNOSIS — M79672 Pain in left foot: Secondary | ICD-10-CM | POA: Insufficient documentation

## 2017-07-15 DIAGNOSIS — M2142 Flat foot [pes planus] (acquired), left foot: Secondary | ICD-10-CM | POA: Insufficient documentation

## 2017-07-15 NOTE — ED Triage Notes (Signed)
Of note, no swelling, redness or pus noted at site of nail entry

## 2017-07-15 NOTE — ED Triage Notes (Signed)
Patient to ED for left foot pain after stepping on a nail last Wednesday. Patient states it is sore to walk on and just not getting any better.

## 2017-07-15 NOTE — ED Provider Notes (Signed)
[facility] Emergency Department Provider Note   ____________________________________________   First MD Initiated Contact with Patient 07/15/17 2141     (approximate)  I have reviewed the triage vital signs and the nursing notes.   HISTORY  Chief Complaint Foot Pain (Stepped on a nail Wednesday of last week)    HPI Brianna Juarez is a 21 y.o. female patient complaining of continued left foot pain status post stepping on a nail 5 days ago. Patient stated pain increases with ambulation. Patient was seen in this facility on date of injury. Patient is not worsens date of injury.Patient was placed on Augmentin, ibuprofen, tramadol. Patient rates the pain as 10 over 10.    Past Medical History:  Diagnosis Date  . H/O gonorrhea   . Obesity (BMI 30-39.9)     Patient Active Problem List   Diagnosis Date Noted  . Post-dates pregnancy 05/10/2016  . Gonorrhea affecting pregnancy in third trimester 05/09/2016  . Trichomonal vaginitis during pregnancy in third trimester 05/09/2016  . Insufficient prenatal care 01/21/2016  . Obesity, Class II, BMI 35-39.9 01/21/2016  . History of prior pregnancy with small for gestational age newborn 01/21/2016    Past Surgical History:  Procedure Laterality Date  . THERAPEUTIC ABORTION  2015    Prior to Admission medications   Medication Sig Start Date End Date Taking? Authorizing Provider  amoxicillin-clavulanate (AUGMENTIN) 875-125 MG tablet Take 1 tablet by mouth 2 (two) times daily. 07/12/17   Triplett, Cari B, FNP  ibuprofen (ADVIL,MOTRIN) 800 MG tablet Take 1 tablet (800 mg total) by mouth every 8 (eight) hours as needed. 05/12/16   Hildred Laser, MD  traMADol (ULTRAM) 50 MG tablet Take 1 tablet (50 mg total) by mouth every 6 (six) hours as needed. 07/12/17   Chinita Pester, FNP    Allergies Patient has no known allergies.  No family history on file.  Social History Social History  Substance Use Topics    . Smoking status: Former Smoker    Packs/day: 0.25    Quit date: 09/13/2015  . Smokeless tobacco: Never Used  . Alcohol use Yes     Comment: occasionaly    Review of Systems Constitutional: No fever/chills Eyes: No visual changes. ENT: No sore throat. Cardiovascular: Denies chest pain. Respiratory: Denies shortness of breath. Gastrointestinal: No abdominal pain.  No nausea, no vomiting.  No diarrhea.  No constipation. Genitourinary: Negative for dysuria. Musculoskeletal: Left foot pain  Skin: Negative for rash. Neurological: Negative for headaches, focal weakness or numbness.   ____________________________________________   PHYSICAL EXAM:  VITAL SIGNS: ED Triage Vitals  Enc Vitals Group     BP 07/15/17 2119 123/72     Pulse Rate 07/15/17 2119 88     Resp 07/15/17 2119 20     Temp 07/15/17 2119 98.7 F (37.1 C)     Temp Source 07/15/17 2119 Oral     SpO2 07/15/17 2119 99 %     Weight 07/15/17 2120 230 lb (104.3 kg)     Height 07/15/17 2120 5\' 1"  (1.549 m)     Head Circumference --      Peak Flow --      Pain Score 07/15/17 2124 10     Pain Loc --      Pain Edu? --      Excl. in GC? --    Constitutional: Alert and oriented. Well appearing and in no acute distress. Morbid obesity Cardiovascular: Normal rate, regular rhythm. Grossly normal heart  sounds.  Good peripheral circulation. Respiratory: Normal respiratory effort.  No retractions. Lungs CTAB. Musculoskeletal: Pes planus bilaterally. Moderate guarding palpation puncture site plantar aspect left foot.  Neurologic:  Normal speech and language. No gross focal neurologic deficits are appreciated. No gait instability. Skin:  Skin is warm, dry and intact. No rash noted. No edema or erythema. Psychiatric: Mood and affect are normal. Speech and behavior are normal.  ____________________________________________   LABS (all labs ordered are listed, but only abnormal results are displayed)  Labs Reviewed - No data  to display ____________________________________________  EKG   ____________________________________________  RADIOLOGY  No results found.  ____________________________________________   PROCEDURES  Procedure(s) performed: None  Procedures  Critical Care performed: No  ____________________________________________   INITIAL IMPRESSION / ASSESSMENT AND PLAN / ED COURSE  Pertinent labs & imaging results that were available during my care of the patient were reviewed by me and considered in my medical decision making (see chart for details).  Continue pain secondary to puncture wound left foot. No signs and symptoms of cellulitis. Patient pes planus and obesity hindering healing process. Patient advised to continue previous medications and given a work note for 2 days. Padding dressing was applied prior to discharge.      ____________________________________________   FINAL CLINICAL IMPRESSION(S) / ED DIAGNOSES  Final diagnoses:  Foot pain, left  Pes planus of both feet      NEW MEDICATIONS STARTED DURING THIS VISIT:  New Prescriptions   No medications on file     Note:  This document was prepared using Dragon voice recognition software and may include unintentional dictation errors.    Joni ReiningSmith, Ronald K, PA-C 07/15/17 2202    Sharman CheekStafford, Phillip, MD 07/15/17 [phone]

## 2018-02-21 ENCOUNTER — Emergency Department
Admission: EM | Admit: 2018-02-21 | Discharge: 2018-02-21 | Disposition: A | Discharge status: Home / Self Care | Payer: Self-pay | Attending: Emergency Medicine | Admitting: Emergency Medicine

## 2018-02-21 ENCOUNTER — Encounter: Payer: Self-pay | Admitting: Emergency Medicine

## 2018-02-21 DIAGNOSIS — Z87891 Personal history of nicotine dependence: Secondary | ICD-10-CM | POA: Insufficient documentation

## 2018-02-21 DIAGNOSIS — J0100 Acute maxillary sinusitis, unspecified: Secondary | ICD-10-CM | POA: Insufficient documentation

## 2018-02-21 MED ORDER — AMOXICILLIN-POT CLAVULANATE 875-125 MG PO TABS: 1.0000 | ORAL_TABLET | Freq: Two times a day (BID) | ORAL | 0 refills | Status: AC

## 2018-02-21 NOTE — ED Triage Notes (Signed)
Pt comes into the ED via POV c/o cough and nasal congestion that started earlier this week.  Denies any N/V, or fevers. Patient states it causes head pressure.  Patient in NAD with even and unlabored respirations.

## 2018-02-22 NOTE — ED Provider Notes (Signed)
[facility] Emergency Department Provider Note  ____________________________________________  Time seen: Approximately 12:00 AM  I have reviewed the triage vital signs and the nursing notes.   HISTORY  Chief Complaint Nasal Congestion and Cough    HPI Brianna Juarez is a 22 y.o. female presents to the emergency department with maxillary and frontal sinus tenderness, malaise and purulent rhinorrhea for one week. Patient denies fever and chills. No cough.  No alleviating measures have been attempted.   Past Medical History:  Diagnosis Date  . H/O gonorrhea   . Obesity (BMI 30-39.9)     Patient Active Problem List   Diagnosis Date Noted  . Post-dates pregnancy 05/10/2016  . Gonorrhea affecting pregnancy in third trimester 05/09/2016  . Trichomonal vaginitis during pregnancy in third trimester 05/09/2016  . Insufficient prenatal care 01/21/2016  . Obesity, Class II, BMI 35-39.9 01/21/2016  . History of prior pregnancy with small for gestational age newborn 01/21/2016    Past Surgical History:  Procedure Laterality Date  . THERAPEUTIC ABORTION  2015    Prior to Admission medications   Medication Sig Start Date End Date Taking? Authorizing Provider  amoxicillin-clavulanate (AUGMENTIN) 875-125 MG tablet Take 1 tablet by mouth 2 (two) times daily for 10 days. 02/21/18 03/03/18  Orvil Feil, PA-C  ibuprofen (ADVIL,MOTRIN) 800 MG tablet Take 1 tablet (800 mg total) by mouth every 8 (eight) hours as needed. 05/12/16   Hildred Laser, MD  traMADol (ULTRAM) 50 MG tablet Take 1 tablet (50 mg total) by mouth every 6 (six) hours as needed. 07/12/17   Chinita Pester, FNP    Allergies Patient has no known allergies.  No family history on file.  Social History Social History   Tobacco Use  . Smoking status: Former Smoker    Packs/day: 0.25    Last attempt to quit: 09/13/2015    Years since quitting: 2.4  . Smokeless tobacco: Never Used  Substance  Use Topics  . Alcohol use: Yes    Comment: occasionaly  . Drug use: No     Review of Systems  Constitutional: No fever/chills Eyes: No visual changes. No discharge ENT: Patient has maxillary and frontal sinus tenderness. Cardiovascular: no chest pain. Respiratory: no cough. No SOB. Gastrointestinal: No abdominal pain.  No nausea, no vomiting.  No diarrhea.  No constipation. Musculoskeletal: Negative for musculoskeletal pain. Skin: Negative for rash, abrasions, lacerations, ecchymosis. Neurological: Negative for headaches, focal weakness or numbness.   ____________________________________________   PHYSICAL EXAM:  VITAL SIGNS: ED Triage Vitals  Enc Vitals Group     BP 02/21/18 2138 (!) 101/58     Pulse Rate 02/21/18 2135 (!) 104     Resp 02/21/18 2135 15     Temp 02/21/18 2135 98.5 F (36.9 C)     Temp Source 02/21/18 2320 Oral     SpO2 02/21/18 2135 97 %     Weight 02/21/18 2137 230 lb (104.3 kg)     Height 02/21/18 2137 5\' 1"  (1.549 m)     Head Circumference --      Peak Flow --      Pain Score 02/21/18 2137 8     Pain Loc --      Pain Edu? --      Excl. in GC? --      Constitutional: Alert and oriented. Well appearing and in no acute distress. Eyes: Conjunctivae are normal. PERRL. EOMI. Head: Atraumatic.  Patient has maxillary and frontal sinus tenderness. ENT:  Ears: TMs are pearly bilaterally.      Nose: No congestion/rhinnorhea.      Mouth/Throat: Mucous membranes are moist.  Hematological/Lymphatic/Immunilogical: No cervical lymphadenopathy.  Cardiovascular: Normal rate, regular rhythm. Normal S1 and S2.  Good peripheral circulation. Respiratory: Normal respiratory effort without tachypnea or retractions. Lungs CTAB. Good air entry to the bases with no decreased or absent breath sounds. Musculoskeletal: Full range of motion to all extremities. No gross deformities appreciated. Neurologic:  Normal speech and language. No gross focal neurologic deficits  are appreciated.  Skin:  Skin is warm, dry and intact. No rash noted. ____________________________________________   LABS (all labs ordered are listed, but only abnormal results are displayed)  Labs Reviewed - No data to display ____________________________________________  EKG   ____________________________________________  RADIOLOGY  No results found.  ____________________________________________    PROCEDURES  Procedure(s) performed:    Procedures    Medications - No data to display   ____________________________________________   INITIAL IMPRESSION / ASSESSMENT AND PLAN / ED COURSE  Pertinent labs & imaging results that were available during my care of the patient were reviewed by me and considered in my medical decision making (see chart for details).  Review of the [place] CSRS was performed in accordance of the [facility] prior to dispensing any controlled drugs.     Assessment and plan Sinusitis Patient presents to the emergency department with maxillary and frontal sinus tenderness, malaise and purulent rhinorrhea for the past week.  History and physical exam findings are consistent with sinusitis.  Patient was discharged with Augmentin.  All patient questions were answered.   ____________________________________________  FINAL CLINICAL IMPRESSION(S) / ED DIAGNOSES  Final diagnoses:  Acute non-recurrent maxillary sinusitis      NEW MEDICATIONS STARTED DURING THIS VISIT:  ED Discharge Orders        Ordered    amoxicillin-clavulanate (AUGMENTIN) 875-125 MG tablet  2 times daily     02/21/18 2313          This chart was dictated using voice recognition software/Dragon. Despite best efforts to proofread, errors can occur which can change the meaning. Any change was purely unintentional.    Orvil FeilWoods, Chonita Gadea M, PA-C 02/22/18 0005    Jeanmarie PlantMcShane, James A, MD 02/22/18 1505

## 2018-08-19 ENCOUNTER — Emergency Department
Admission: EM | Admit: 2018-08-19 | Discharge: 2018-08-19 | Disposition: A | Discharge status: Home / Self Care | Payer: Self-pay | Attending: Emergency Medicine | Admitting: Emergency Medicine

## 2018-08-19 ENCOUNTER — Other Ambulatory Visit: Payer: Self-pay

## 2018-08-19 DIAGNOSIS — Y9301 Activity, walking, marching and hiking: Secondary | ICD-10-CM | POA: Insufficient documentation

## 2018-08-19 DIAGNOSIS — W108XXA Fall (on) (from) other stairs and steps, initial encounter: Secondary | ICD-10-CM | POA: Insufficient documentation

## 2018-08-19 DIAGNOSIS — Y999 Unspecified external cause status: Secondary | ICD-10-CM | POA: Insufficient documentation

## 2018-08-19 DIAGNOSIS — S01511A Laceration without foreign body of lip, initial encounter: Secondary | ICD-10-CM | POA: Insufficient documentation

## 2018-08-19 DIAGNOSIS — Z87891 Personal history of nicotine dependence: Secondary | ICD-10-CM | POA: Insufficient documentation

## 2018-08-19 DIAGNOSIS — Y929 Unspecified place or not applicable: Secondary | ICD-10-CM | POA: Insufficient documentation

## 2018-08-19 MED ORDER — LIDOCAINE-EPINEPHRINE 2 %-1:100000 IJ SOLN
INTRAMUSCULAR | Status: AC
  Filled 2018-08-19: qty 1

## 2018-08-19 MED ORDER — LIDOCAINE-EPINEPHRINE 2 %-1:100000 IJ SOLN
1.7000 mL | Freq: Once | INTRAMUSCULAR | Status: AC
  Administered 2018-08-19: 1.7 mL

## 2018-08-19 NOTE — ED Triage Notes (Signed)
Lac to lower lip fell and teeth went through lip.

## 2018-08-19 NOTE — ED Notes (Signed)
ED Provider at bedside. 

## 2018-08-19 NOTE — ED Provider Notes (Signed)
[facility] Emergency Department Provider Note  ____________________________________________   First MD Initiated Contact with Patient 08/19/18 0138     (approximate)  I have reviewed the triage vital signs and the nursing notes.   HISTORY  Chief Complaint Laceration   HPI Brianna Juarez is a 22 y.o. female who was presented to the emergency department with a lip laceration.  Says that she fell down several stairs by mistake while having fun with friends.  Says that when she fell she believes that her teeth cut into her lower lip and caused a laceration.  She is denying any loose teeth, loss of consciousness or headache.  Denies any jaw pain.  Says that her last tetanus shot was in the past 5 years.  Past Medical History:  Diagnosis Date  . H/O gonorrhea   . Obesity (BMI 30-39.9)     Patient Active Problem List   Diagnosis Date Noted  . Post-dates pregnancy 05/10/2016  . Gonorrhea affecting pregnancy in third trimester 05/09/2016  . Trichomonal vaginitis during pregnancy in third trimester 05/09/2016  . Insufficient prenatal care 01/21/2016  . Obesity, Class II, BMI 35-39.9 01/21/2016  . History of prior pregnancy with small for gestational age newborn 01/21/2016    Past Surgical History:  Procedure Laterality Date  . THERAPEUTIC ABORTION  2015    Prior to Admission medications   Medication Sig Start Date End Date Taking? Authorizing Provider  ibuprofen (ADVIL,MOTRIN) 800 MG tablet Take 1 tablet (800 mg total) by mouth every 8 (eight) hours as needed. 05/12/16   Hildred Laser, MD  traMADol (ULTRAM) 50 MG tablet Take 1 tablet (50 mg total) by mouth every 6 (six) hours as needed. 07/12/17   Chinita Pester, FNP    Allergies Patient has no known allergies.  No family history on file.  Social History Social History   Tobacco Use  . Smoking status: Former Smoker    Packs/day: 0.25    Last attempt to quit: 09/13/2015    Years since  quitting: 2.9  . Smokeless tobacco: Never Used  Substance Use Topics  . Alcohol use: Yes    Comment: occasionaly  . Drug use: No    Review of Systems  Constitutional: No fever/chills Eyes: No visual changes. ENT: No sore throat. Cardiovascular: Denies chest pain. Respiratory: Denies shortness of breath. Gastrointestinal: No abdominal pain.  No nausea, no vomiting.  No diarrhea.  No constipation. Genitourinary: Negative for dysuria. Musculoskeletal: Negative for back pain. Skin: Negative for rash. Neurological: Negative for headaches, focal weakness or numbness.   ____________________________________________   PHYSICAL EXAM:  VITAL SIGNS: ED Triage Vitals  Enc Vitals Group     BP 08/19/18 0137 133/84     Pulse Rate 08/19/18 0137 89     Resp --      Temp 08/19/18 0137 98.7 F (37.1 C)     Temp Source 08/19/18 0137 Oral     SpO2 08/19/18 0137 100 %     Weight 08/19/18 0129 220 lb (99.8 kg)     Height 08/19/18 0129 5\' 1"  (1.549 m)     Head Circumference --      Peak Flow --      Pain Score 08/19/18 0129 9     Pain Loc --      Pain Edu? --      Excl. in GC? --     Constitutional: Alert and oriented. Well appearing and in no acute distress. Eyes: Conjunctivae are normal.  Head: Atraumatic. Nose: No congestion/rhinnorhea. Mouth/Throat: Mucous membranes are moist.   3 cm laceration to the midline of the lower lip mucosal surface which does not cross the vermilion border.  Approximately 3 to 4 mm deep without any active bleeding at this time.  Wound is not contaminated.  No loose teeth palpated.  No deformity.  No tenderness to the mandible.  No trismus.  Neck: No stridor.   Cardiovascular: Normal rate, regular rhythm. Grossly normal heart sounds.  Respiratory: Normal respiratory effort.  No retractions. Lungs CTAB. Gastrointestinal: Soft and nontender. No distention Musculoskeletal: No lower extremity tenderness nor edema.  No joint effusions. Neurologic:  Normal  speech and language. No gross focal neurologic deficits are appreciated. Skin:  Skin is warm, dry and intact. No rash noted. Psychiatric: Mood and affect are normal. Speech and behavior are normal.  ____________________________________________   LABS (all labs ordered are listed, but only abnormal results are displayed)  Labs Reviewed - No data to display ____________________________________________  EKG   ____________________________________________  RADIOLOGY   ____________________________________________   PROCEDURES  Procedure(s) performed:    Marland KitchenMarland KitchenLaceration Repair Date/Time: 08/19/2018 2:35 AM Performed by: Myrna Blazer, MD Authorized by: Myrna Blazer, MD   Consent:    Consent obtained:  Verbal   Consent given by:  Patient   Risks discussed:  Infection, pain, retained foreign body, poor cosmetic result and poor wound healing Anesthesia (see MAR for exact dosages):    Anesthesia method:  Local infiltration and nerve block   Local anesthetic:  Lidocaine 1% WITH epi   Block location:  Mental, bilateral   Block needle gauge:  25 G   Block anesthetic:  Lidocaine 1% WITH epi   Block injection procedure:  Anatomic landmarks identified, introduced needle, incremental injection and negative aspiration for blood   Block outcome:  Incomplete block Laceration details:    Location:  Lip   Lip location:  Lower interior lip   Length (cm):  3   Depth (mm):  4 Repair type:    Repair type:  Simple Pre-procedure details:    Preparation:  Patient was prepped and draped in usual sterile fashion Exploration:    Hemostasis achieved with:  Direct pressure   Wound exploration: entire depth of wound probed and visualized     Contaminated: no   Treatment:    Wound cleansed with: alcohol swab.   Visualized foreign bodies/material removed: no   Skin repair:    Repair method:  Sutures   Suture size:  4-0   Wound skin closure material used: vicryl.   Suture  technique:  Simple interrupted   Number of sutures:  5 Approximation:    Approximation:  Close Post-procedure details:    Dressing:  Sterile dressing   Patient tolerance of procedure:  Tolerated well, no immediate complications    Critical Care performed:   ____________________________________________   INITIAL IMPRESSION / ASSESSMENT AND PLAN / ED COURSE  Pertinent labs & imaging results that were available during my care of the patient were reviewed by me and considered in my medical decision making (see chart for details).  DDX: Lip laceration, facial trauma As part of my medical decision making, I reviewed the following data within the electronic MEDICAL RECORD NUMBER Notes from prior ED visits  ----------------------------------------- 2:37 AM on 08/19/2018 -----------------------------------------  Patient tolerated suturing well.  Understands that these are dissolvable sutures.  Will give follow-up for wound check.  Patient be discharged at this time.  We also discussed switching and  spitting after meals to make sure to cleanse the wound.  She is understanding of the diagnosis as well as treatment and willing to comply. ____________________________________________   FINAL CLINICAL IMPRESSION(S) / ED DIAGNOSES  Lip laceration.  NEW MEDICATIONS STARTED DURING THIS VISIT:  New Prescriptions   No medications on file     Note:  This document was prepared using Dragon voice recognition software and may include unintentional dictation errors.     Myrna Blazer, MD 08/19/18 [phone]

## 2019-05-13 ENCOUNTER — Ambulatory Visit: Payer: Self-pay

## 2019-07-21 ENCOUNTER — Emergency Department
Admission: EM | Admit: 2019-07-21 | Discharge: 2019-07-21 | Disposition: A | Discharge status: Home / Self Care | Payer: Self-pay | Attending: Emergency Medicine | Admitting: Emergency Medicine

## 2019-07-21 ENCOUNTER — Other Ambulatory Visit: Payer: Self-pay

## 2019-07-21 ENCOUNTER — Encounter: Payer: Self-pay | Admitting: Emergency Medicine

## 2019-07-21 DIAGNOSIS — R509 Fever, unspecified: Secondary | ICD-10-CM | POA: Insufficient documentation

## 2019-07-21 DIAGNOSIS — J020 Streptococcal pharyngitis: Secondary | ICD-10-CM | POA: Insufficient documentation

## 2019-07-21 DIAGNOSIS — Z87891 Personal history of nicotine dependence: Secondary | ICD-10-CM | POA: Insufficient documentation

## 2019-07-21 MED ORDER — AMOXICILLIN 500 MG PO CAPS
1000.0000 mg | ORAL_CAPSULE | Freq: Once | ORAL | Status: AC
  Administered 2019-07-21: 1000 mg via ORAL
  Filled 2019-07-21: qty 2

## 2019-07-21 MED ORDER — ACETAMINOPHEN 325 MG PO TABS
650.0000 mg | ORAL_TABLET | Freq: Once | ORAL | Status: AC | PRN
  Administered 2019-07-21: 650 mg via ORAL
  Filled 2019-07-21: qty 2

## 2019-07-21 MED ORDER — MAGIC MOUTHWASH W/LIDOCAINE: 5.0000 mL | Freq: Four times a day (QID) | ORAL | 0 refills | Status: AC

## 2019-07-21 MED ORDER — AMOXICILLIN 875 MG PO TABS: 875.0000 mg | ORAL_TABLET | Freq: Two times a day (BID) | ORAL | 0 refills | Status: AC

## 2019-07-21 NOTE — ED Triage Notes (Signed)
Pt presents to ED with sore throat and fever since last night. Pt states she thinks she may have strep throat. Denies any other complaints at this time.

## 2019-07-21 NOTE — ED Provider Notes (Signed)
[facility] Emergency Department Provider Note  ____________________________________________  Time seen: Approximately 8:22 PM  I have reviewed the triage vital signs and the nursing notes.   HISTORY  Chief Complaint Sore Throat and Fever    HPI Brianna Juarez is a 23 y.o. female who presents the emergency department complaining of sore throat, fever.  Patient reports a sore throat started last night.  It has worsened in intensity.  No difficulty breathing or swallowing.  Patient has developed a fever today.  She states that it feels like she is swallowing razor blades.  Patient is concerned that she has strep.  She is had strep in the past and it feels the same.  She does not know of any direct contact with any other person with illness.  She denies any body aches, nasal congestion, cough, shortness of breath, abdominal pain, nausea vomiting, diarrhea or constipation.  Patient has low concern for COVID-19.         Past Medical History:  Diagnosis Date  . H/O gonorrhea   . Obesity (BMI 30-39.9)     Patient Active Problem List   Diagnosis Date Noted  . Post-dates pregnancy 05/10/2016  . Gonorrhea affecting pregnancy in third trimester 05/09/2016  . Trichomonal vaginitis during pregnancy in third trimester 05/09/2016  . Insufficient prenatal care 01/21/2016  . Obesity, Class II, BMI 35-39.9 01/21/2016  . History of prior pregnancy with small for gestational age newborn 01/21/2016    Past Surgical History:  Procedure Laterality Date  . THERAPEUTIC ABORTION  2015    Prior to Admission medications   Medication Sig Start Date End Date Taking? Authorizing Provider  amoxicillin (AMOXIL) 875 MG tablet Take 1 tablet (875 mg total) by mouth 2 (two) times daily. 07/21/19   Dewitte Vannice, Delorise RoyalsJonathan D, PA-C  ibuprofen (ADVIL,MOTRIN) 800 MG tablet Take 1 tablet (800 mg total) by mouth every 8 (eight) hours as needed. 05/12/16   Hildred Laserherry, Anika, MD  magic mouthwash  w/lidocaine SOLN Take 5 mLs by mouth 4 (four) times daily. 07/21/19   Ceniyah Thorp, Delorise RoyalsJonathan D, PA-C  traMADol (ULTRAM) 50 MG tablet Take 1 tablet (50 mg total) by mouth every 6 (six) hours as needed. 07/12/17   Chinita Pesterriplett, Cari B, FNP    Allergies Patient has no known allergies.  No family history on file.  Social History Social History   Tobacco Use  . Smoking status: Former Smoker    Packs/day: 0.25    Quit date: 09/13/2015    Years since quitting: 3.8  . Smokeless tobacco: Never Used  Substance Use Topics  . Alcohol use: Yes    Comment: occasionaly  . Drug use: No     Review of Systems  Constitutional: Positive fever/chills Eyes: No visual changes. No discharge ENT: Positive for sore throat Cardiovascular: no chest pain. Respiratory: no cough. No SOB. Gastrointestinal: No abdominal pain.  No nausea, no vomiting.  No diarrhea.  No constipation. Musculoskeletal: Negative for musculoskeletal pain. Skin: Negative for rash, abrasions, lacerations, ecchymosis. Neurological: Negative for headaches, focal weakness or numbness. 10-point ROS otherwise negative.  ____________________________________________   PHYSICAL EXAM:  VITAL SIGNS: ED Triage Vitals  Enc Vitals Group     BP 07/21/19 1955 (!) 108/93     Pulse Rate 07/21/19 1955 (!) 141     Resp 07/21/19 1955 20     Temp 07/21/19 1955 (!) 103.3 F (39.6 C)     Temp Source 07/21/19 1955 Oral     SpO2 07/21/19 1955 97 %  Weight 07/21/19 1958 220 lb (99.8 kg)     Height 07/21/19 1958 5\' 1"  (1.549 m)     Head Circumference --      Peak Flow --      Pain Score --      Pain Loc --      Pain Edu? --      Excl. in GC? --      Constitutional: Alert and oriented. Well appearing and in no acute distress. Eyes: Conjunctivae are normal. PERRL. EOMI. Head: Atraumatic. ENT:      Ears:       Nose: No congestion/rhinnorhea.      Mouth/Throat: Mucous membranes are moist.  Visualization of the oropharynx reveals bilateral  tonsillar erythema, edema and exudates.  Tonsils are equally edematous.  Uvula is midline.  No other oropharyngeal abnormality.  Anterior neck and sublingual region are soft to palpation. Neck: No stridor.  Neck is supple full range of motion. Hematological/Lymphatic/Immunilogical: Diffuse, mobile, tender anterior cervical lymphadenopathy. Cardiovascular: Normal rate, regular rhythm. Normal S1 and S2.  Good peripheral circulation. Respiratory: Normal respiratory effort without tachypnea or retractions. Lungs CTAB. Good air entry to the bases with no decreased or absent breath sounds. Musculoskeletal: Full range of motion to all extremities. No gross deformities appreciated. Neurologic:  Normal speech and language. No gross focal neurologic deficits are appreciated.  Skin:  Skin is warm, dry and intact. No rash noted. Psychiatric: Mood and affect are normal. Speech and behavior are normal. Patient exhibits appropriate insight and judgement.   ____________________________________________   LABS (all labs ordered are listed, but only abnormal results are displayed)  Labs Reviewed - No data to display ____________________________________________  EKG   ____________________________________________  RADIOLOGY   No results found.  ____________________________________________    PROCEDURES  Procedure(s) performed:    Procedures    Medications  amoxicillin (AMOXIL) capsule 1,000 mg (has no administration in time range)  acetaminophen (TYLENOL) tablet 650 mg (650 mg Oral Given 07/21/19 2007)     ____________________________________________   INITIAL IMPRESSION / ASSESSMENT AND PLAN / ED COURSE  Pertinent labs & imaging results that were available during my care of the patient were reviewed by me and considered in my medical decision making (see chart for details).  Review of the [place] CSRS was performed in accordance of the [facility] prior to dispensing any controlled drugs.         The patient was evaluated for the symptoms described in the history of present illness. The patient was evaluated in the context of the global COVID-19 pandemic, which necessitated consideration that the patient might be at risk for infection with the SARS-CoV-2 virus that causes COVID-19. Institutional protocols and algorithms that pertain to the evaluation of patients at risk for COVID-19 are in a state of rapid change based on information released by regulatory bodies including the CDC and federal and state organizations. The most current policies and algorithms were followed during the patient's care in the ED.    Patient's diagnosis is consistent with strep pharyngitis.  Patient presents emergency department complaining of sore throat, fever.  Differential included viral pharyngitis, strep pharyngitis, mono, peritonsillar abscess, COVID-19.  Exam is most consistent with strep pharyngitis.  I have offered testing but I will treat the patient regardless based off of Cent[employer] criteria.  Patient meets 4 out of 5 Centor criteria with only exception being a gender 29.  Patient declines strep test and requested treatment at this time.  Patient will be treated with amoxicillin  and Magic mouthwash for symptom relief.  Follow-up primary care as needed..Patient is given ED precautions to return to the ED for any worsening or new symptoms.     ____________________________________________  FINAL CLINICAL IMPRESSION(S) / ED DIAGNOSES  Final diagnoses:  Strep pharyngitis      NEW MEDICATIONS STARTED DURING THIS VISIT:  ED Discharge Orders         Ordered    amoxicillin (AMOXIL) 875 MG tablet  2 times daily     07/21/19 2033    magic mouthwash w/lidocaine SOLN  4 times daily    Note to Pharmacy: Dispense in a 1/1/1 ratio. Use lidocaine, diphenhydramine, prednisolone   07/21/19 2033              This chart was dictated using voice recognition software/Dragon. Despite best efforts to  proofread, errors can occur which can change the meaning. Any change was purely unintentional.    Darletta Moll, PA-C 07/21/19 2034    Nena Polio, MD 07/21/19 [phone]

## 2020-06-25 ENCOUNTER — Other Ambulatory Visit: Payer: Self-pay

## 2020-06-28 ENCOUNTER — Other Ambulatory Visit: Payer: Self-pay

## 2020-06-28 ENCOUNTER — Ambulatory Visit (LOCAL_COMMUNITY_HEALTH_CENTER): Payer: Self-pay

## 2020-06-28 DIAGNOSIS — Z111 Encounter for screening for respiratory tuberculosis: Secondary | ICD-10-CM

## 2020-07-01 ENCOUNTER — Ambulatory Visit (LOCAL_COMMUNITY_HEALTH_CENTER): Payer: Self-pay

## 2020-07-01 ENCOUNTER — Other Ambulatory Visit: Payer: Self-pay

## 2020-07-01 DIAGNOSIS — Z111 Encounter for screening for respiratory tuberculosis: Secondary | ICD-10-CM

## 2020-07-01 LAB — TB SKIN TEST
Induration: 0 mm
TB Skin Test: NEGATIVE

## 2022-03-05 ENCOUNTER — Emergency Department: Payer: Self-pay

## 2022-03-05 ENCOUNTER — Emergency Department
Admission: EM | Admit: 2022-03-05 | Discharge: 2022-03-06 | Disposition: A | Discharge status: Home / Self Care | Payer: Self-pay | Attending: Emergency Medicine | Admitting: Emergency Medicine

## 2022-03-05 ENCOUNTER — Other Ambulatory Visit: Payer: Self-pay

## 2022-03-05 DIAGNOSIS — R0602 Shortness of breath: Secondary | ICD-10-CM

## 2022-03-05 DIAGNOSIS — J069 Acute upper respiratory infection, unspecified: Secondary | ICD-10-CM | POA: Insufficient documentation

## 2022-03-05 DIAGNOSIS — Z20822 Contact with and (suspected) exposure to covid-19: Secondary | ICD-10-CM | POA: Insufficient documentation

## 2022-03-05 LAB — COMPREHENSIVE METABOLIC PANEL WITH GFR
ALT: 21 U/L (ref 0–44)
AST: 23 U/L (ref 15–41)
Albumin: 3.7 g/dL (ref 3.5–5.0)
Alkaline Phosphatase: 81 U/L (ref 38–126)
Anion gap: 7 (ref 5–15)
BUN: 13 mg/dL (ref 6–20)
CO2: 27 mmol/L (ref 22–32)
Calcium: 9.5 mg/dL (ref 8.9–10.3)
Chloride: 104 mmol/L (ref 98–111)
Creatinine, Ser: 0.86 mg/dL (ref 0.44–1.00)
GFR, Estimated: >60 mL/min (ref >60)
Glucose, Bld: 101 mg/dL — ABNORMAL HIGH (ref 70–99)
Potassium: 3.7 mmol/L (ref 3.5–5.1)
Sodium: 138 mmol/L (ref 135–145)
Total Bilirubin: 0.3 mg/dL (ref 0.3–1.2)
Total Protein: 8 g/dL (ref 6.5–8.1)

## 2022-03-05 LAB — CBC
HCT: 38.7 % (ref 36.0–46.0)
Hemoglobin: 11.9 g/dL — ABNORMAL LOW (ref 12.0–15.0)
MCH: 25.3 pg — ABNORMAL LOW (ref 26.0–34.0)
MCHC: 30.7 g/dL (ref 30.0–36.0)
MCV: 82.3 fL (ref 80.0–100.0)
Platelets: 454 10*3/uL — ABNORMAL HIGH (ref 150–400)
RBC: 4.7 MIL/uL (ref 3.87–5.11)
RDW: 13.4 % (ref 11.5–15.5)
WBC: 13.5 10*3/uL — ABNORMAL HIGH (ref 4.0–10.5)
nRBC: 0 % (ref 0.0–0.2)

## 2022-03-05 NOTE — ED Provider Notes (Signed)
? ?[facility] ?Provider Note ? ? ? Event Date/Time  ? First MD Initiated Contact with Patient 03/05/22 2255   ?  (approximate) ? ? ?History  ? ?Shortness of Breath ? ? ?HPI ? ?Brianna Juarez is a 26 y.o. female with history of obesity who presents to the emergency department with 3 to 4 days of shortness of breath.  States she has been exposed to her children who have had a viral upper respiratory infection.  States she initially had some congestion, cough and wheezing but this has resolved and now she is still feeling short of breath with exertion.  No chest pain, fever, lower extremity swelling or pain.  No history of ACS, PE, DVT, asthma, COPD. ? ? ?History provided by patient and significant other. ? ? ? ?Past Medical History:  ?Diagnosis Date  ? H/O gonorrhea   ? Obesity (BMI 30-39.9)   ? ? ?Past Surgical History:  ?Procedure Laterality Date  ? THERAPEUTIC ABORTION  2015  ? ? ?MEDICATIONS:  ?Prior to Admission medications   ?Medication Sig Start Date End Date Taking? Authorizing Provider  ?amoxicillin (AMOXIL) 875 MG tablet Take 1 tablet (875 mg total) by mouth 2 (two) times daily. ?Patient not taking: Reported on 06/28/2020 07/21/19   Cuthriell, Delorise Royals, PA-C  ?ibuprofen (ADVIL,MOTRIN) 800 MG tablet Take 1 tablet (800 mg total) by mouth every 8 (eight) hours as needed. ?Patient not taking: Reported on 06/28/2020 05/12/16   Hildred Laser, MD  ?magic mouthwash w/lidocaine SOLN Take 5 mLs by mouth 4 (four) times daily. ?Patient not taking: Reported on 06/28/2020 07/21/19   Cuthriell, Delorise Royals, PA-C  ?traMADol (ULTRAM) 50 MG tablet Take 1 tablet (50 mg total) by mouth every 6 (six) hours as needed. ?Patient not taking: Reported on 06/28/2020 07/12/17   Chinita Pester, FNP  ? ? ?Physical Exam  ? ?Triage Vital Signs: ?ED Triage Vitals  ?Enc Vitals Group  ?   BP 03/05/22 2247 (!) 147/99  ?   Pulse Rate 03/05/22 2247 100  ?   Resp 03/05/22 2247 20  ?   Temp 03/05/22 2247 98.6 ?F (37 ?C)  ?    Temp Source 03/05/22 2247 Oral  ?   SpO2 03/05/22 2248 95 %  ?   Weight 03/05/22 2252 230 lb (104.3 kg)  ?   Height 03/05/22 2252 5\' 1"  (1.549 m)  ?   Head Circumference --   ?   Peak Flow --   ?   Pain Score 03/05/22 2251 0  ?   Pain Loc --   ?   Pain Edu? --   ?   Excl. in GC? --   ? ? ?Most recent vital signs: ?Vitals:  ? 03/05/22 2330 03/06/22 0000  ?BP: (!) 155/97 127/64  ?Pulse: 98 95  ?Resp: (!) 36 18  ?Temp:    ?SpO2: 96% 94%  ? ? ?CONSTITUTIONAL: Alert and oriented and responds appropriately to questions. Well-appearing; well-nourished, obese, in no distress ?HEAD: Normocephalic, atraumatic ?EYES: Conjunctivae clear, pupils appear equal, sclera nonicteric ?ENT: normal nose; moist mucous membranes ?NECK: Supple, normal ROM ?CARD: RRR; S1 and S2 appreciated; no murmurs, no clicks, no rubs, no gallops ?RESP: Normal chest excursion without splinting or tachypnea; breath sounds clear and equal bilaterally; no wheezes, no rhonchi, no rales, no hypoxia or respiratory distress, speaking full sentences ?ABD/GI: Normal bowel sounds; non-distended; soft, non-tender, no rebound, no guarding, no peritoneal signs ?BACK: The back appears normal ?EXT: Normal ROM in  all joints; no deformity noted, no edema; no cyanosis, no calf tenderness or calf swelling ?SKIN: Normal color for age and race; warm; no rash on exposed skin ?NEURO: Moves all extremities equally, normal speech ?PSYCH: The patient's mood and manner are appropriate. ? ? ?ED Results / Procedures / Treatments  ? ?LABS: ?(all labs ordered are listed, but only abnormal results are displayed) ?Labs Reviewed  ?CBC - Abnormal; Notable for the following components:  ?    Result Value  ? WBC 13.5 (*)   ? Hemoglobin 11.9 (*)   ? MCH 25.3 (*)   ? Platelets 454 (*)   ? All other components within normal limits  ?COMPREHENSIVE METABOLIC PANEL - Abnormal; Notable for the following components:  ? Glucose, Bld 101 (*)   ? All other components within normal limits  ?RESP PANEL  BY RT-PCR (FLU A&B, COVID) ARPGX2  ?D-DIMER, QUANTITATIVE (NOT AT [facility])  ?TROPONIN I (HIGH SENSITIVITY)  ? ? ? ?EKG: ? ? Date: 03/06/2022 22:53 ? Rate: 101 ? Rhythm: sinus tachycardia ? QRS Axis: normal ? Intervals: normal ? ST/T Wave abnormalities: normal ? Conduction Disutrbances: none ? Narrative Interpretation: Sinus tachycardia ? ? ? ? ?RADIOLOGY: ?My personal review and interpretation of imaging: Chest x-ray clear. ? ?I have personally reviewed all radiology reports.   ?DG Chest 2 View ? ?Result Date: 03/05/2022 ?CLINICAL DATA:  Shortness of breath EXAM: CHEST - 2 VIEW COMPARISON:  None. FINDINGS: The heart size and mediastinal contours are within normal limits. Both lungs are clear. The visualized skeletal structures are unremarkable. IMPRESSION: No active cardiopulmonary disease. Electronically Signed   By: Deatra RobinsonKevin  Herman M.D.   On: 03/05/2022 23:15   ? ? ?PROCEDURES: ? ?Critical Care performed: No ? ? ? ? ? ?.1-3 Lead EKG Interpretation ?Performed by: Raiford Fetterman, Layla MawKristen N, DO ?Authorized by: Lynnsie Linders, Layla MawKristen N, DO  ? ?  Interpretation: abnormal   ?  ECG rate:  104 ?  ECG rate assessment: tachycardic   ?  Rhythm: sinus tachycardia   ?  Ectopy: none   ?  Conduction: normal   ? ? ? ?IMPRESSION / MDM / ASSESSMENT AND PLAN / ED COURSE  ?I reviewed the triage vital signs and the nursing notes. ? ? ? ?Patient here with shortness of breath after recent cough, congestion, wheezing.  No history of pulmonary or cardiac issues. ? ?The patient is on the cardiac monitor to evaluate for evidence of arrhythmia and/or significant heart rate changes. ? ? ?DIFFERENTIAL DIAGNOSIS (includes but not limited to):   Bronchospasm, viral URI, pneumonia, PE, less likely ACS, dissection, CHF, pneumothorax ? ? ?PLAN: We will obtain CBC, BMP, troponin, D-dimer, EKG, chest x-ray.  Currently asymptomatic at rest and satting 100% on room air.  Lungs clear to auscultation without wheezing.  No history of asthma or COPD. ? ? ?MEDICATIONS GIVEN IN  ED: ?Medications - No data to display ? ? ?ED COURSE: Patient's labs show mild leukocytosis of 13.5.  Normal hemoglobin, electrolytes.  COVID and flu negative.  X-ray reviewed by myself and radiologist shows no pneumonia or pneumothorax.  EKG is nonischemic.  No hypoxia or respiratory distress here.  Suspect viral URI causing symptoms.  Does not need antibiotics or steroids today.  No active bronchospasm but states she has been wheezing at home.  Will discharge with an albuterol inhaler.  No history of asthma or COPD.  Discussed supportive care instructions and using over-the-counter medications such as dextromethorphan, guaifenesin.  She has a PCP for follow-up. ? ? ?  At this time, I do not feel there is any life-threatening condition present. I reviewed all nursing notes, vitals, pertinent previous records.  All lab and urine results, EKGs, imaging ordered have been independently reviewed and interpreted by myself.  I reviewed all available radiology reports from any imaging ordered this visit.  Based on my assessment, I feel the patient is safe to be discharged home without further emergent workup and can continue workup as an outpatient as needed. Discussed all findings, treatment plan as well as usual and customary return precautions with patient.  They verbalize understanding and are comfortable with this plan.  Outpatient follow-up has been provided as needed.  All questions have been answered. ? ? ? ?CONSULTS: Admission not required at this time given patient is well-appearing, nontoxic, afebrile without hypoxia or respiratory distress with a reassuring work-up. ? ? ?OUTSIDE RECORDS REVIEWED: Reviewed patient's last office visit with Vilma Meckel on 06/22/2016. ? ? ? ? ? ? ? ? ?FINAL CLINICAL IMPRESSION(S) / ED DIAGNOSES  ? ?Final diagnoses:  ?SOB (shortness of breath)  ?Viral URI with cough  ? ? ? ?Rx / DC Orders  ? ?ED Discharge Orders   ? ?      Ordered  ?  albuterol (VENTOLIN HFA) 108 (90 Base) MCG/ACT  inhaler  Every 4 hours PRN       ? 03/06/22 0012  ? ?  ?  ? ?  ? ? ? ?Note:  This document was prepared using Dragon voice recognition software and may include unintentional dictation errors. ?  ?Devonne Lalani, Ria Bush

## 2022-03-05 NOTE — ED Triage Notes (Signed)
Pt states she feels like she is having trouble breathing- pt states it has been on and off for a couple days- pt states she has had a cough- pt denies fever- pt states her kids were sick ?

## 2022-03-06 LAB — D-DIMER, QUANTITATIVE: D-Dimer, Quant: 0.39 ug/mL-FEU (ref 0.00–0.50)

## 2022-03-06 LAB — RESP PANEL BY RT-PCR (FLU A&B, COVID) ARPGX2
Influenza A by PCR: NEGATIVE
Influenza B by PCR: NEGATIVE
SARS Coronavirus 2 by RT PCR: NEGATIVE

## 2022-03-06 LAB — TROPONIN I (HIGH SENSITIVITY): Troponin I (High Sensitivity): 4 ng/L (ref <18)

## 2022-03-06 MED ORDER — ALBUTEROL SULFATE HFA 108 (90 BASE) MCG/ACT IN AERS: 2.0000 | INHALATION_SPRAY | RESPIRATORY_TRACT | 0 refills | Status: AC | PRN

## 2024-03-04 IMAGING — CR DG CHEST 2V
2 series · 2 of 2 positions shown · non-contrast
Comparison: None.

CLINICAL DATA: Shortness of breath

EXAM:
CHEST - 2 VIEW

[chest pa]
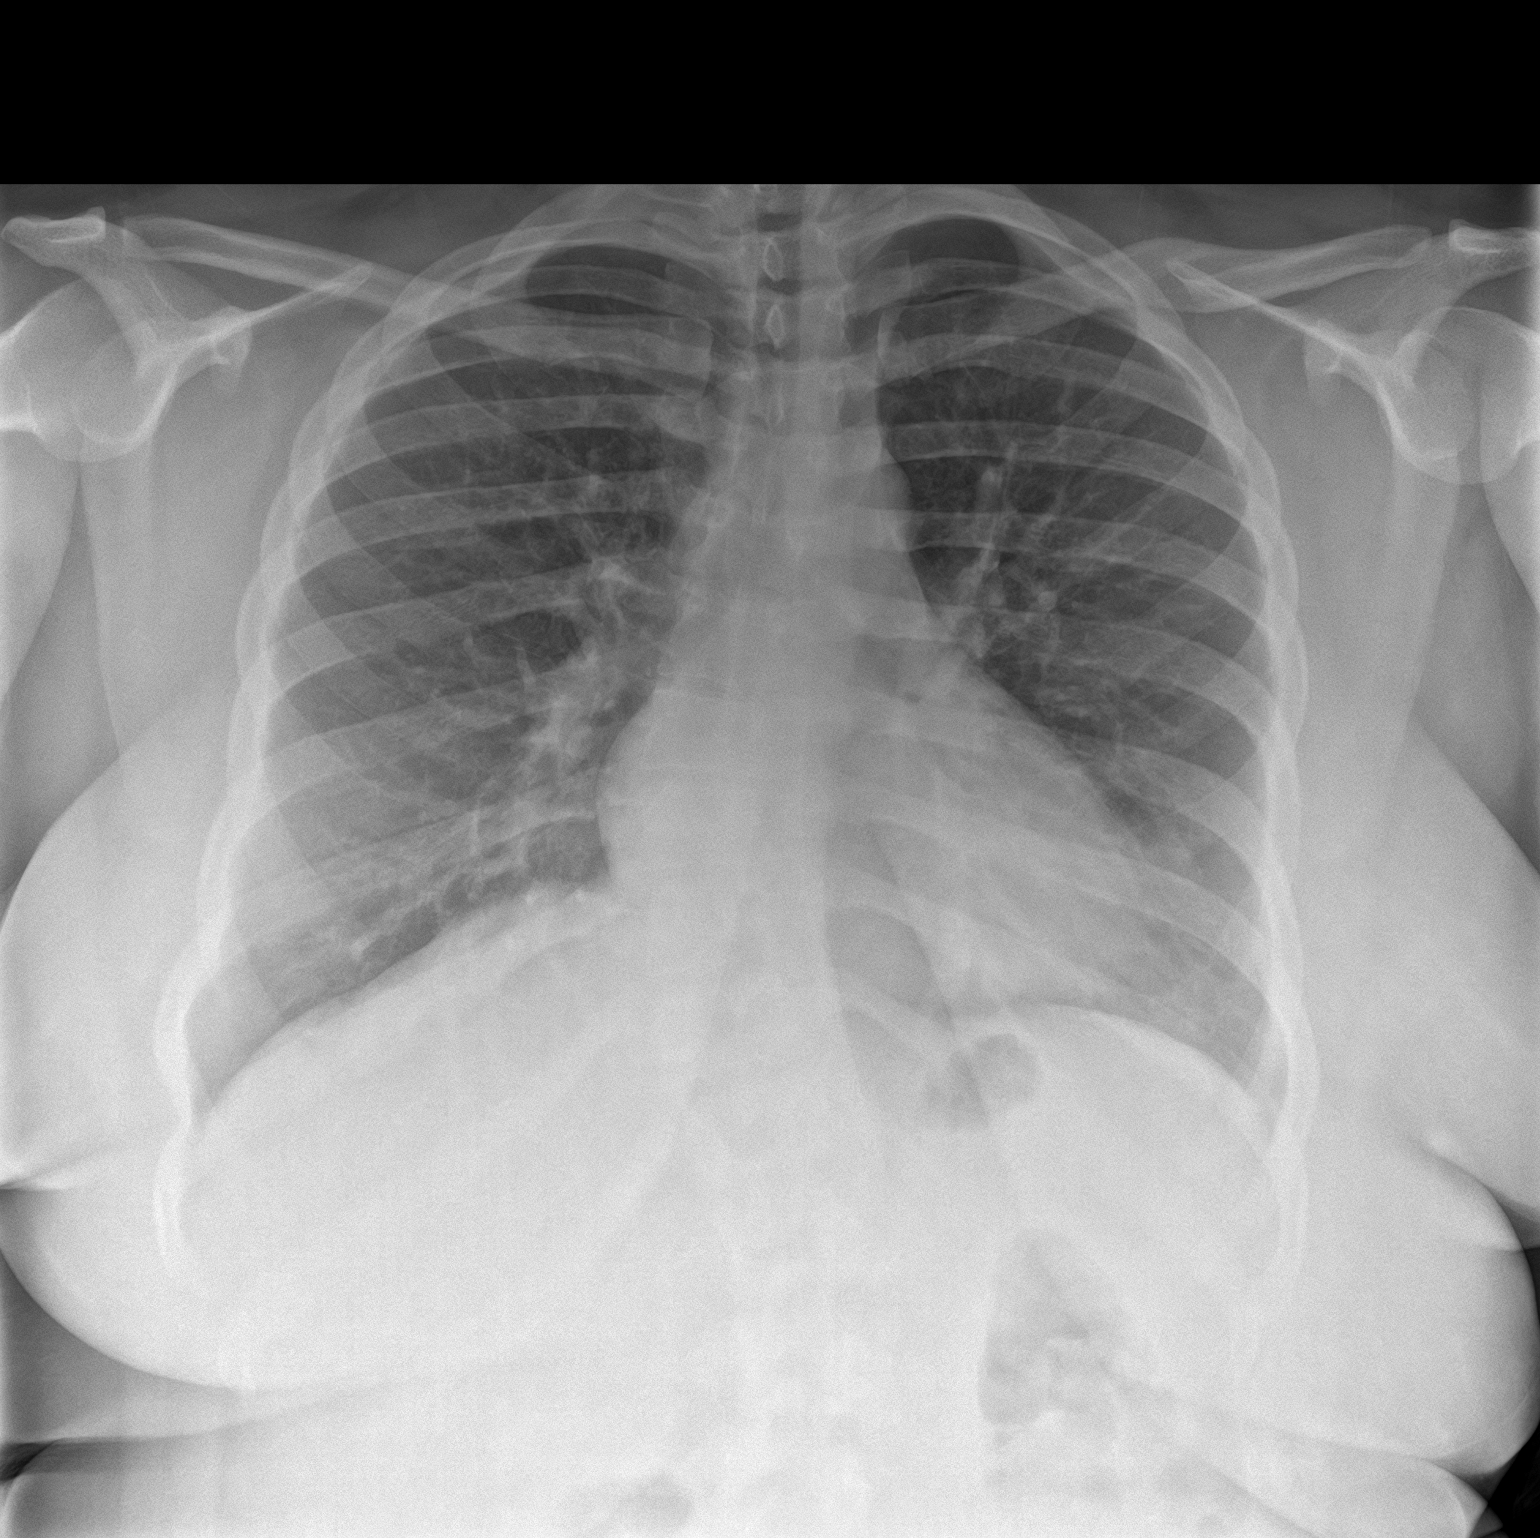

[chest lat]
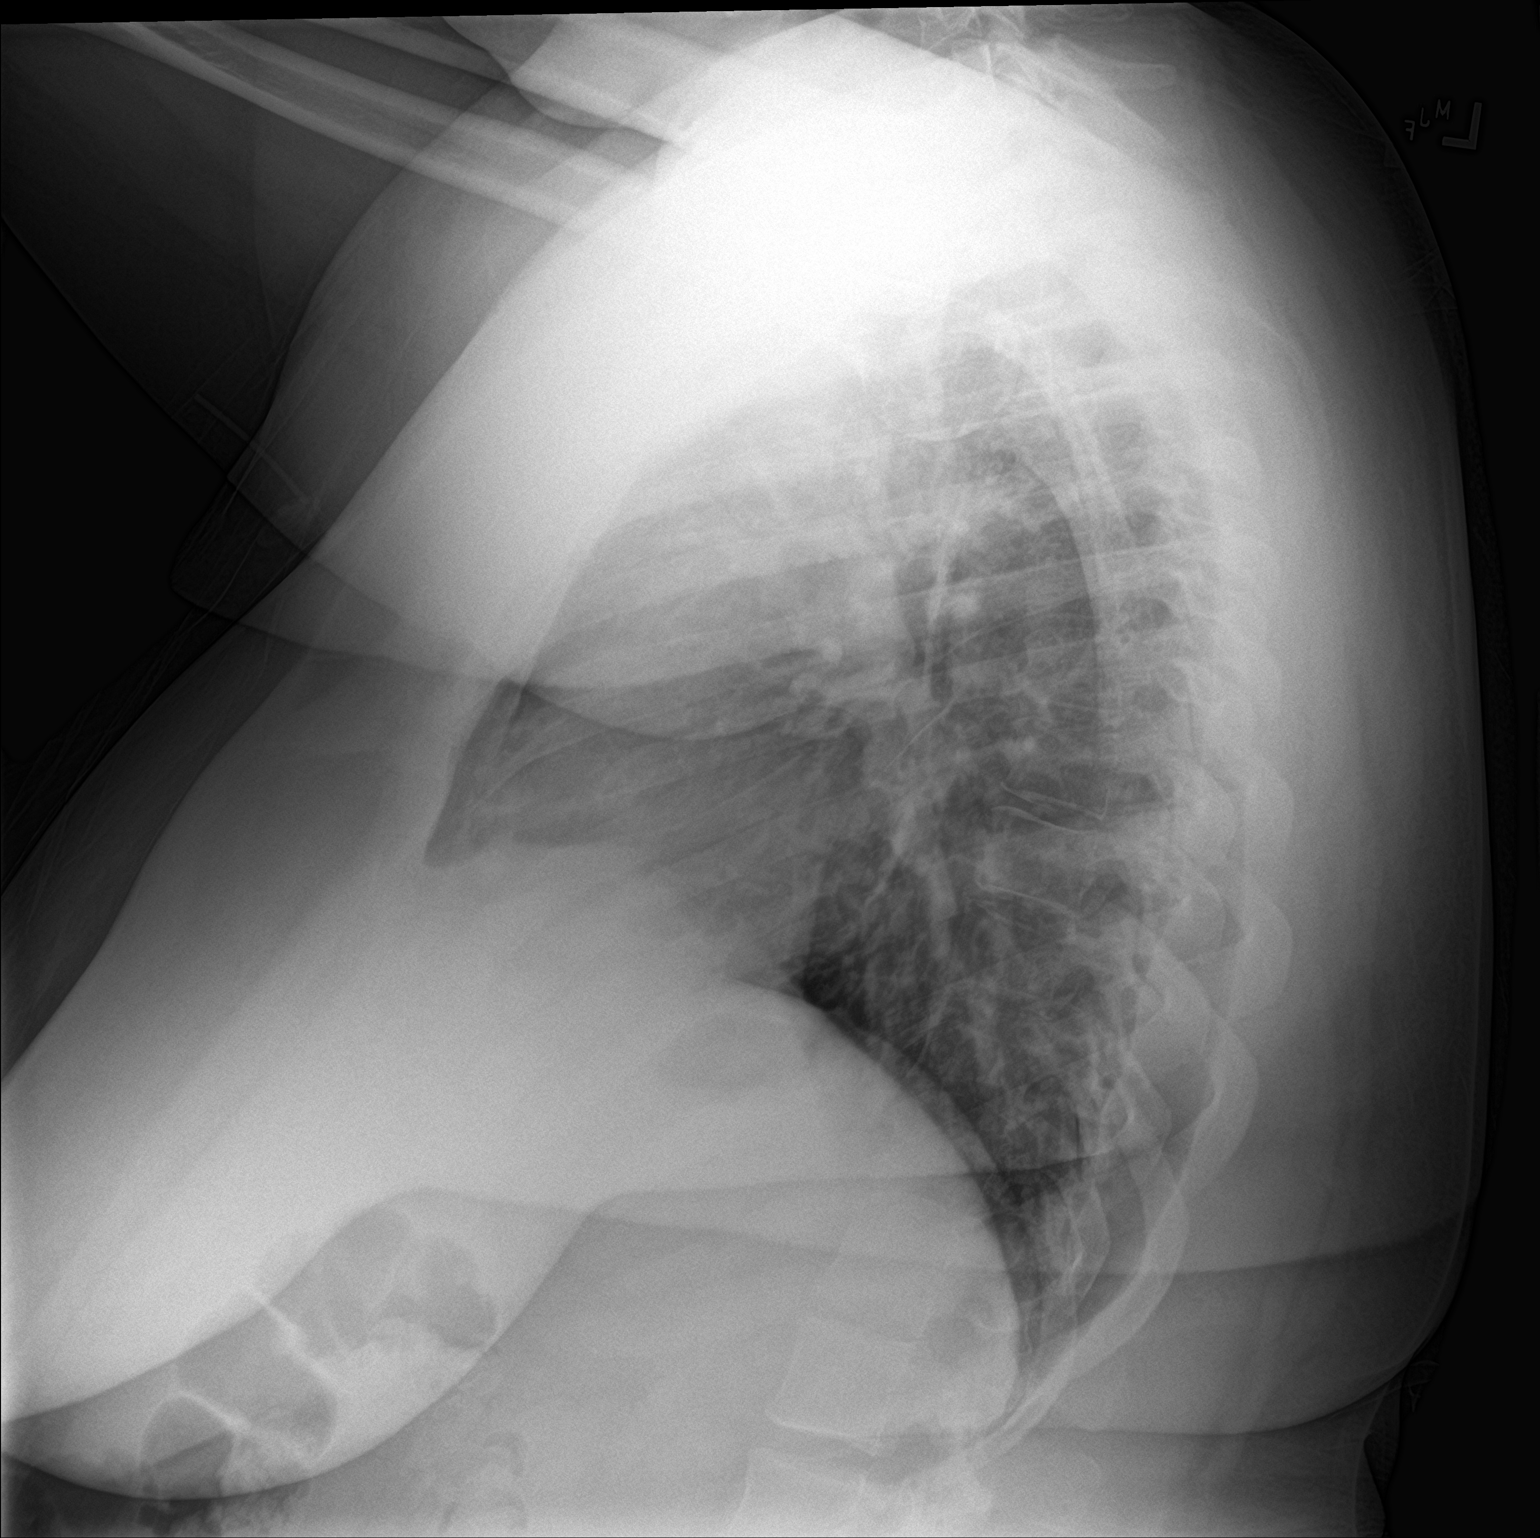

[2 of 2 positions shown; findings below may reference images not displayed]

FINDINGS: The heart size and mediastinal contours are within normal limits.
Both lungs are clear. The visualized skeletal structures are
unremarkable.
IMPRESSION: No active cardiopulmonary disease.

## 2024-10-15 ENCOUNTER — Emergency Department
Admission: EM | Admit: 2024-10-15 | Discharge: 2024-10-15 | Disposition: A | Discharge status: Home / Self Care | Payer: Self-pay | Attending: Emergency Medicine | Admitting: Emergency Medicine

## 2024-10-15 ENCOUNTER — Encounter: Payer: Self-pay | Admitting: Emergency Medicine

## 2024-10-15 ENCOUNTER — Other Ambulatory Visit: Payer: Self-pay

## 2024-10-15 DIAGNOSIS — F419 Anxiety disorder, unspecified: Secondary | ICD-10-CM | POA: Insufficient documentation

## 2024-10-15 NOTE — ED Provider Notes (Signed)
 [facility] Provider Note    Event Date/Time   First MD Initiated Contact with Patient 10/15/24 1146     (approximate)   History   Anxiety   HPI  Brianna Juarez is a 28 y.o. female with no PMH presents for evaluation of anxiety. Patient states earlier today when she was on her way to work she had a very short period of shortness of breath that lasted for a few seconds and she felt dizzy. She states she is back to normal now.  And does not have any complaints.  She states that the same thing happened yesterday and endorses being under lots of stress.      Physical Exam   Triage Vital Signs: ED Triage Vitals  Encounter Vitals Group     BP 10/15/24 1123 121/79     Girls Systolic BP Percentile --      Girls Diastolic BP Percentile --      Boys Systolic BP Percentile --      Boys Diastolic BP Percentile --      Pulse Rate 10/15/24 1123 98     Resp 10/15/24 1123 20     Temp 10/15/24 1123 98.2 F (36.8 C)     Temp src --      SpO2 10/15/24 1123 99 %     Weight --      Height --      Head Circumference --      Peak Flow --      Pain Score 10/15/24 1128 0     Pain Loc --      Pain Education --      Exclude from Growth Chart --     Most recent vital signs: Vitals:   10/15/24 1123  BP: 121/79  Pulse: 98  Resp: 20  Temp: 98.2 F (36.8 C)  SpO2: 99%   General: Awake, no distress.  CV:  Good peripheral perfusion.  RRR. Resp:  Normal effort.  CTAB. Abd:  No distention.  Other:     ED Results / Procedures / Treatments   Labs (all labs ordered are listed, but only abnormal results are displayed) Labs Reviewed - No data to display   PROCEDURES:  Critical Care performed: No  Procedures   MEDICATIONS ORDERED IN ED: Medications - No data to display   IMPRESSION / MDM / ASSESSMENT AND PLAN / ED COURSE  I reviewed the triage vital signs and the nursing notes.                             28 year old female presents for  evaluation of anxiety.  Vital signs are stable patient NAD on exam.  Differential diagnosis includes, but is not limited to, anxiety, panic attack, reactive airway disease, pneumonia, PE.  Patient's presentation is most consistent with acute, uncomplicated illness.  Physical exam is reassuring.  Patient states she is fully back to baseline now.  She described having a very short period of difficulty breathing that resolved on its own.  She has not had any fevers or chest pain.  Do not suspect PE and based on PERC criteria I can rule this out.  Does not sound like patient had a panic attack but I do believe this is related to anxiety.  Patient also endorses lots of stress right now.  Given that she is back to her baseline and has no other complaints do not feel that further  emergent workup is needed so did not obtain blood work or imaging.  Patient was given a note for work.  She voiced understanding, all questions were answered and she was stable at discharge.      FINAL CLINICAL IMPRESSION(S) / ED DIAGNOSES   Final diagnoses:  Anxiety     Rx / DC Orders   ED Discharge Orders     None        Note:  This document was prepared using Dragon voice recognition software and may include unintentional dictation errors.   Cleaster Tinnie LABOR, PA-C 10/15/24 1311    Viviann Pastor, MD 10/15/24 [phone]

## 2024-10-15 NOTE — Discharge Instructions (Signed)
Return to the emergency department with any worsening symptoms.

## 2024-10-15 NOTE — ED Notes (Signed)
 Pt states she's had a couple episodes of SOB and dizziness. No hx of asthma or COPD. States she has some normal life stressors lately. Says the episodes happen randomly.

## 2024-10-15 NOTE — ED Triage Notes (Signed)
 Patient states she was on her way to work felt like it was difficult for her to breathe for just a second after drinking some water. Symptoms resolved and denies any complaints at this time.

## 2024-11-19 ENCOUNTER — Emergency Department
Admission: EM | Admit: 2024-11-19 | Discharge: 2024-11-19 | Disposition: A | Discharge status: Home / Self Care | Payer: Self-pay | Attending: Emergency Medicine | Admitting: Emergency Medicine

## 2024-11-19 ENCOUNTER — Other Ambulatory Visit: Payer: Self-pay

## 2024-11-19 DIAGNOSIS — J019 Acute sinusitis, unspecified: Secondary | ICD-10-CM | POA: Insufficient documentation

## 2024-11-19 DIAGNOSIS — H6121 Impacted cerumen, right ear: Secondary | ICD-10-CM | POA: Insufficient documentation

## 2024-11-19 MED ORDER — CARBAMIDE PEROXIDE 6.5 % OT SOLN: 5.0000 [drp] | Freq: Two times a day (BID) | OTIC | 2 refills | Status: AC

## 2024-11-19 MED ORDER — AMOXICILLIN 875 MG PO TABS: 875.0000 mg | ORAL_TABLET | Freq: Two times a day (BID) | ORAL | 0 refills | Status: AC

## 2024-11-19 NOTE — ED Triage Notes (Signed)
 Left ear clogged x 3 weeks and sinus congestion.  Denies fever/chills.  AAOx3. Skin warm and dry. NAD

## 2024-11-19 NOTE — Discharge Instructions (Signed)
 I have sent an antibiotic to the pharmacy.  Please take this medication as prescribed.  Make sure you take all the medication even if you begin to feel better.  Please continue using over-the-counter treatments including cold medicine and allergy medicine.  You have earwax buildup in your right ear.  Please avoid using Q-tips as this is what caused this to happen.  I have sent eardrops for you to use.  Please use them twice a day for the next 4 days.  This will help to slowly soften and break up the earwax so that you can rinse it out while in the shower.

## 2024-11-19 NOTE — ED Provider Notes (Signed)
 "  [facility] Provider Note    Event Date/Time   First MD Initiated Contact with Patient 11/19/24 [phone]     (approximate)   History   Ear Problem   HPI  Brianna Juarez is a 29 y.o. female with PMH of obesity presents for evaluation of sinus congestion and her right ear being clogged.  Patient has not had fevers or chills.  Patient reports that she has had sinus congestion for the past 2 weeks.  She has tried over-the-counter medications without much relief.  Patient reports decreased hearing in the right ear.  No drainage from the ear.  No ear pain.      Physical Exam   Triage Vital Signs: ED Triage Vitals  Encounter Vitals Group     BP 11/19/24 0838 (!) 137/91     Girls Systolic BP Percentile --      Girls Diastolic BP Percentile --      Boys Systolic BP Percentile --      Boys Diastolic BP Percentile --      Pulse Rate 11/19/24 0838 91     Resp 11/19/24 0838 16     Temp 11/19/24 0838 (!) 97.5 F (36.4 C)     Temp Source 11/19/24 0838 Oral     SpO2 11/19/24 0838 96 %     Weight 11/19/24 0836 229 lb 15 oz (104.3 kg)     Height --      Head Circumference --      Peak Flow --      Pain Score 11/19/24 0837 0     Pain Loc --      Pain Education --      Exclude from Growth Chart --     Most recent vital signs: Vitals:   11/19/24 0838  BP: (!) 137/91  Pulse: 91  Resp: 16  Temp: (!) 97.5 F (36.4 C)  SpO2: 96%   General: Awake, no distress.  CV:  Good peripheral perfusion.  Resp:  Normal effort. Abd:  No distention.  Other:  Cerumen impaction in right ear, left TM translucent   ED Results / Procedures / Treatments   Labs (all labs ordered are listed, but only abnormal results are displayed) Labs Reviewed - No data to display   PROCEDURES:  Critical Care performed: No  Procedures   MEDICATIONS ORDERED IN ED: Medications - No data to display   IMPRESSION / MDM / ASSESSMENT AND PLAN / ED COURSE  I reviewed the triage  vital signs and the nursing notes.                             29 year old female presents for evaluation of congestion and ear being clogged.  Blood pressure was elevated but vital signs are stable otherwise.  Patient NAD on exam.  Differential diagnosis includes, but is not limited to, cerumen impaction, otitis media, otitis externa, viral sinusitis, bacterial sinusitis, allergies  Patient's presentation is most consistent with acute, uncomplicated illness.  Patient has a cerumen impaction in the right ear.  Attempted to remove earwax using a curette but the wax is so deep in the ear procedure was terminated as there was concern that I may cause a tympanic membrane rupture.  Will give patient prescription for Debrox solution.  In regards to patient's congestion, she has had symptoms for greater than 2 weeks so we will try treating with antibiotics.  I did advise patient  to continue the over-the-counter treatments.  Patient was given a note for work.  She voiced understanding, all questions were answered and she stable at discharge.      FINAL CLINICAL IMPRESSION(S) / ED DIAGNOSES   Final diagnoses:  Impacted cerumen of right ear  Acute sinusitis, recurrence not specified, unspecified location     Rx / DC Orders   ED Discharge Orders          Ordered    carbamide peroxide (DEBROX) 6.5 % OTIC solution  2 times daily        11/19/24 0927    amoxicillin  (AMOXIL ) 875 MG tablet  2 times daily        11/19/24 9072             Note:  This document was prepared using Dragon voice recognition software and may include unintentional dictation errors.   Cleaster Tinnie LABOR, PA-C 11/19/24 9071    Dorothyann Drivers, MD 11/19/24 1306  "
